# Patient Record
Sex: Female | Born: 1975 | Race: White | Hispanic: No | Marital: Single | Smoking: Current every day smoker
Health system: Southern US, Community
[De-identification: ages and names within clinical notes are randomized; demographics above are authoritative.]

## PROBLEM LIST (undated history)

## (undated) HISTORY — PX: HERNIA REPAIR: SHX51

## (undated) HISTORY — PX: ORTHOPEDIC SURGERY: SHX850

---

## 2009-12-30 ENCOUNTER — Encounter: Payer: Self-pay | Admitting: Emergency Medicine

## 2009-12-30 ENCOUNTER — Observation Stay (HOSPITAL_COMMUNITY): Admission: EM | Admit: 2009-12-30 | Discharge: 2010-01-01 | Payer: Self-pay | Admitting: Emergency Medicine

## 2010-06-07 LAB — CBC
HCT: 35.5 % — ABNORMAL LOW (ref 36.0–46.0)
MCH: 30.1 pg (ref 26.0–34.0)
MCHC: 33.7 g/dL (ref 30.0–36.0)
MCV: 91.5 fL (ref 78.0–100.0)
Platelets: 223 10*3/uL (ref 150–400)
Platelets: 233 10*3/uL (ref 150–400)
RBC: 3.88 MIL/uL (ref 3.87–5.11)
WBC: 9.6 10*3/uL (ref 4.0–10.5)

## 2010-06-07 LAB — BASIC METABOLIC PANEL
BUN: 11 mg/dL (ref 6–23)
CO2: 25 mEq/L (ref 19–32)
Calcium: 9.2 mg/dL (ref 8.4–10.5)
Chloride: 111 mEq/L (ref 96–112)
Chloride: 113 mEq/L — ABNORMAL HIGH (ref 96–112)
Creatinine, Ser: 0.64 mg/dL (ref 0.4–1.2)
Creatinine, Ser: 0.64 mg/dL (ref 0.4–1.2)
GFR calc Af Amer: >60 mL/min (ref >60)
GFR calc Af Amer: >60 mL/min (ref >60)
Glucose, Bld: 103 mg/dL — ABNORMAL HIGH (ref 70–99)
Potassium: 3.1 mEq/L — ABNORMAL LOW (ref 3.5–5.1)
Potassium: 3.5 mEq/L (ref 3.5–5.1)
Sodium: 140 mEq/L (ref 135–145)

## 2010-06-07 LAB — DIFFERENTIAL
Basophils Relative: 0 % (ref 0–1)
Eosinophils Absolute: 0.1 10*3/uL (ref 0.0–0.7)
Neutrophils Relative %: 74 % (ref 43–77)

## 2010-06-07 LAB — GLUCOSE, CAPILLARY: Glucose-Capillary: 103 mg/dL — ABNORMAL HIGH (ref 70–99)

## 2010-06-07 LAB — RAPID URINE DRUG SCREEN, HOSP PERFORMED: Tetrahydrocannabinol: NOT DETECTED
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
## 2012-12-28 ENCOUNTER — Emergency Department (HOSPITAL_COMMUNITY)
Admission: EM | Admit: 2012-12-28 | Discharge: 2012-12-29 | Disposition: A | Discharge status: Home / Self Care | Payer: PRIVATE HEALTH INSURANCE | Attending: Emergency Medicine | Admitting: Emergency Medicine

## 2012-12-28 ENCOUNTER — Encounter (HOSPITAL_COMMUNITY): Payer: Self-pay | Admitting: Emergency Medicine

## 2012-12-28 DIAGNOSIS — M25539 Pain in unspecified wrist: Secondary | ICD-10-CM | POA: Insufficient documentation

## 2012-12-28 DIAGNOSIS — D1739 Benign lipomatous neoplasm of skin and subcutaneous tissue of other sites: Secondary | ICD-10-CM | POA: Insufficient documentation

## 2012-12-28 DIAGNOSIS — F17200 Nicotine dependence, unspecified, uncomplicated: Secondary | ICD-10-CM | POA: Insufficient documentation

## 2012-12-28 DIAGNOSIS — D171 Benign lipomatous neoplasm of skin and subcutaneous tissue of trunk: Secondary | ICD-10-CM

## 2012-12-28 DIAGNOSIS — M25531 Pain in right wrist: Secondary | ICD-10-CM

## 2012-12-28 NOTE — ED Notes (Signed)
Patient c/o knot in her left mid back. Patient also c/o knot in her abdomen that she noticed last night.  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

## 2012-12-29 MED ORDER — OXYCODONE-ACETAMINOPHEN 5-325 MG PO TABS
1.0000 | ORAL_TABLET | Freq: Once | ORAL | Status: AC
  Administered 2012-12-29: 1 via ORAL
  Filled 2012-12-29: qty 1

## 2012-12-29 NOTE — ED Provider Notes (Signed)
CSN: [id number]     Arrival date & time 12/28/12  2320 History   First MD Initiated Contact with Patient 12/28/12 2352     Chief Complaint - right wrist pain pain, knots on back and abdomen  Patient is a 37 y.o. female presenting with wrist pain. The history is provided by the patient.  Wrist Pain This is a new problem. The current episode started more than 2 days ago. The problem occurs daily. The problem has not changed since onset.Exacerbated by: movement. The symptoms are relieved by rest. She has tried rest for the symptoms. The treatment provided no relief.  pt presents for two complaints She reports right wrist pain for awhile as while she is at work she uses a knife and has to frequently bend her wrist and this causes pain.  No injury to the hand or wrist.    Also reports "knots" on her back and abdomen.  The knot on her back has been present for up to a month and the one on her abdome she just noticed.  No overlying redness.  No fever/vomiting   PMH - none  Past Surgical History  Procedure Laterality Date  . Cesarean section    . Orthopedic surgery     No family history on file. History  Substance Use Topics  . Smoking status: Current Some Day Smoker  . Smokeless tobacco: Not on file  . Alcohol Use: No   OB History   Grav Para Term Preterm Abortions TAB SAB Ect Mult Living                 Review of Systems  Constitutional: Negative for fever.  Gastrointestinal: Negative for vomiting.    Allergies  Review of patient's allergies indicates no known allergies.  Home Medications   Current Outpatient Rx  Name  Route  Sig  Dispense  Refill  . ALPRAZolam (XANAX) 1 MG tablet   Oral   Take 1 mg by mouth at bedtime as needed for sleep.          BP 143/98  Pulse 94  Temp(Src) 98.4 F (36.9 C) (Oral)  Resp 18  Ht 5\' 7"  (1.702 m)  Wt 185 lb (83.915 kg)  BMI 28.97 kg/m2  SpO2 98%  LMP 12/20/2012 Physical Exam CONSTITUTIONAL: Well developed/well nourished HEAD:  Normocephalic/atraumatic EYES: EOMI ENMT: Mucous membranes moist NECK: supple no meningeal signs CV: S1/S2 noted, no murmurs/rubs/gallops noted LUNGS: Lungs are clear to auscultation bilaterally, no apparent distress ABDOMEN: soft, nontender, no rebound or guarding GU:no cva tenderness NEURO: Pt is awake/alert, moves all extremitiesx4 EXTREMITIES: pulses normal, full ROM, mild tenderness to palpation of right wrist, no erythema/warmth or edema noted.  She can fully range right wrist SKIN: warm, color normal.  One small ?lipoma to left flank and one small lesion on left upper abdomen.  No overlying warmth.  The lesions are mobile. PSYCH: no abnormalities of mood noted  ED Course  Procedures   MDM   1. Lipoma of abdominal wall   2. Lipoma of back   3. Right wrist pain    Nursing notes including past medical history and social history reviewed and considered in documentation  She may have small lipomas under skin.  At this point she is well appearing and in no distress.  Advised if they become larger over next month or more painful, she should have them re-evaluated For her wrist pain, advised splint and ortho followup     Joya Gaskins, MD  12/29/12 0008 

## 2012-12-30 ENCOUNTER — Telehealth: Payer: Self-pay | Admitting: Orthopedic Surgery

## 2012-12-30 NOTE — Telephone Encounter (Signed)
Katherine Huff called to request an appointment for wrist pain.  Said she has been to [employer].  She said this is work related and she is to get the workers [employer] information from her employer.  She will call us back once she is speaks with her employer.  If employer agrees that this is work related she will get all information  And call us back.

## 2019-04-01 ENCOUNTER — Other Ambulatory Visit: Payer: Self-pay

## 2019-04-01 ENCOUNTER — Ambulatory Visit: Payer: PRIVATE HEALTH INSURANCE | Attending: Internal Medicine

## 2019-04-01 DIAGNOSIS — Z20822 Contact with and (suspected) exposure to covid-19: Secondary | ICD-10-CM

## 2019-04-03 LAB — NOVEL CORONAVIRUS, NAA: SARS-CoV-2, NAA: NOT DETECTED

## 2022-01-05 ENCOUNTER — Encounter (HOSPITAL_COMMUNITY): Payer: Self-pay | Admitting: *Deleted

## 2022-01-05 ENCOUNTER — Other Ambulatory Visit: Payer: Self-pay

## 2022-01-05 ENCOUNTER — Emergency Department (HOSPITAL_COMMUNITY)
Admission: EM | Admit: 2022-01-05 | Discharge: 2022-01-05 | Disposition: A | Discharge status: Home / Self Care | Payer: PRIVATE HEALTH INSURANCE | Attending: Emergency Medicine | Admitting: Emergency Medicine

## 2022-01-05 DIAGNOSIS — K0889 Other specified disorders of teeth and supporting structures: Secondary | ICD-10-CM

## 2022-01-05 MED ORDER — HYDROCODONE-ACETAMINOPHEN 5-325 MG PO TABS
2.0000 | ORAL_TABLET | Freq: Once | ORAL | Status: AC
  Administered 2022-01-05: 2 via ORAL
  Filled 2022-01-05: qty 2

## 2022-01-05 MED ORDER — IBUPROFEN 400 MG PO TABS
600.0000 mg | ORAL_TABLET | Freq: Once | ORAL | Status: AC
  Administered 2022-01-05: 600 mg via ORAL
  Filled 2022-01-05: qty 2

## 2022-01-05 MED ORDER — NAPROXEN 500 MG PO TABS: 500.0000 mg | ORAL_TABLET | Freq: Two times a day (BID) | ORAL | 0 refills | Status: DC

## 2022-01-05 MED ORDER — OXYCODONE HCL 5 MG PO TABS: 5.0000 mg | ORAL_TABLET | ORAL | 0 refills | Status: DC | PRN

## 2022-01-05 NOTE — Discharge Instructions (Addendum)
You were seen today for dental pain.  Please follow-up as soon as possible with dentistry for further evaluation and management of your upper right molar.  Please take the prescribed anti-inflammatory naproxen as directed.  I also recommend taking 1000 mg of Tylenol every 6 hours (2 extra strength Tylenol every 6 hours).  The Roxicodone that was prescribed is for breakthrough pain.  This pain will likely persist until dental follow-up.

## 2022-01-05 NOTE — ED Provider Notes (Signed)
[facility] EMERGENCY DEPARTMENT Provider Note   CSN: [id number] Arrival date & time: 01/05/22  1936     History  Chief Complaint  Patient presents with   Dental Pain    Katherine Huff is a 46 y.o. female.  Patient presents to the hospital with chief complaint of dental pain.  Patient states that for the past week she has noticed a broken tooth in the upper right side.  She states the pain has been increasing over the past week.  She denies any fevers, nausea, vomiting.  Denies any difficulty swallowing.  Past medical history noncontributory  HPI     Home Medications Prior to Admission medications   Medication Sig Start Date End Date Taking? Authorizing Provider  naproxen (NAPROSYN) 500 MG tablet Take 1 tablet (500 mg total) by mouth 2 (two) times daily. 01/05/22  Yes Dorothyann Peng, PA-C  oxyCODONE (ROXICODONE) 5 MG immediate release tablet Take 1 tablet (5 mg total) by mouth every 4 (four) hours as needed for severe pain. 01/05/22  Yes Dorothyann Peng, PA-C  ALPRAZolam Duanne Moron) 1 MG tablet Take 1 mg by mouth at bedtime as needed for sleep.    [provider]      Allergies    Patient has no known allergies.    Review of Systems   Review of Systems  HENT:  Positive for dental problem.     Physical Exam Updated Vital Signs BP (!) 183/120 (BP Location: Right Arm)   Pulse 89   Temp 97.8 F (36.6 C) (Oral)   Resp 18   Ht '5\' 7"'$  (1.702 m)   Wt 86.2 kg   LMP 12/19/2021   SpO2 100%   BMI 29.76 kg/m  Physical Exam HENT:     Head: Normocephalic and atraumatic.     Mouth/Throat:     Mouth: Mucous membranes are moist.     Dentition: Abnormal dentition. Dental tenderness present. No gingival swelling or dental abscesses.     Pharynx: Uvula midline. No uvula swelling.     Tonsils: No tonsillar abscesses.   Eyes:     Pupils: Pupils are equal, round, and reactive to light.  Cardiovascular:     Rate and Rhythm: Normal rate.  Pulmonary:     Effort:  Pulmonary effort is normal. No respiratory distress.  Musculoskeletal:        General: No signs of injury.     Cervical back: Normal range of motion.  Skin:    General: Skin is dry.     Capillary Refill: Capillary refill takes less than 2 seconds.  Neurological:     Mental Status: She is alert.  Psychiatric:        Speech: Speech normal.        Behavior: Behavior normal.     ED Results / Procedures / Treatments   Labs (all labs ordered are listed, but only abnormal results are displayed) Labs Reviewed - No data to display  EKG None  Radiology No results found.  Procedures Procedures    Medications Ordered in ED Medications  HYDROcodone-acetaminophen (NORCO/VICODIN) 5-325 MG per tablet 2 tablet (2 tablets Oral Given 01/05/22 2027)  ibuprofen (ADVIL) tablet 600 mg (600 mg Oral Given 01/05/22 2027)    ED Course/ Medical Decision Making/ A&P                           Medical Decision Making Risk Prescription drug management.   Patient presents with  a chief complaint of dental pain.  Differential diagnosis includes but is not limited to dental injury, dental abscess, peritonsillar abscess, dental caries, and others  Patient has no past medical history that is relevant.  There is no indication at this time for lab work.  There is medication at this time for imaging  I ordered the patient hydrocodone and ibuprofen for pain and inflammation.  Pending assessment the patient was feeling somewhat better  I see no signs of a dental abscess at this time.  No swelling or significant erythema.  Patient's voice is normal.  Uvula is midline.  No signs of peritonsillar or retropharyngeal abscess.  Patient declines difficulty swallowing.  This appears to be pain due to a dental carry which is led to an injury of the upper right molar.  Patient states she has a dentist that she has not seen in some time and plans to call on Monday morning to schedule an urgent appointment.  Patient  states she has not had insurance for a few years and just recently got her dental insurance back. Plan to discharge patient home with a short course of pain medication and prescription anti-inflammatories. Patient urged to follow up with dentistry as soon as possible for follow up.         Final Clinical Impression(s) / ED Diagnoses Final diagnoses:  Pain, dental    Rx / DC Orders ED Discharge Orders          Ordered    oxyCODONE (ROXICODONE) 5 MG immediate release tablet  Every 4 hours PRN        01/05/22 2034    naproxen (NAPROSYN) 500 MG tablet  2 times daily        01/05/22 2034              Ronny Bacon 01/05/22 2035    Hayden Rasmussen, MD 01/06/22 1045

## 2022-01-05 NOTE — ED Triage Notes (Signed)
Pt with right upper dental pain x 1 week, pt states she noticed one tooth was broken.  Pt now has pain to right ear that just started today.

## 2023-01-25 ENCOUNTER — Encounter (HOSPITAL_COMMUNITY): Payer: Self-pay

## 2023-01-25 ENCOUNTER — Emergency Department (HOSPITAL_COMMUNITY): Payer: BC Managed Care – PPO

## 2023-01-25 ENCOUNTER — Other Ambulatory Visit: Payer: Self-pay

## 2023-01-25 ENCOUNTER — Emergency Department (HOSPITAL_COMMUNITY)
Admission: EM | Admit: 2023-01-25 | Discharge: 2023-01-26 | Disposition: A | Discharge status: Home / Self Care | Payer: BC Managed Care – PPO

## 2023-01-25 DIAGNOSIS — R0600 Dyspnea, unspecified: Secondary | ICD-10-CM | POA: Insufficient documentation

## 2023-01-25 DIAGNOSIS — R0789 Other chest pain: Secondary | ICD-10-CM | POA: Diagnosis not present

## 2023-01-25 DIAGNOSIS — D649 Anemia, unspecified: Secondary | ICD-10-CM

## 2023-01-25 DIAGNOSIS — R000 Tachycardia, unspecified: Secondary | ICD-10-CM | POA: Diagnosis not present

## 2023-01-25 DIAGNOSIS — R0602 Shortness of breath: Secondary | ICD-10-CM | POA: Diagnosis not present

## 2023-01-25 DIAGNOSIS — R030 Elevated blood-pressure reading, without diagnosis of hypertension: Secondary | ICD-10-CM

## 2023-01-25 DIAGNOSIS — R7989 Other specified abnormal findings of blood chemistry: Secondary | ICD-10-CM | POA: Diagnosis not present

## 2023-01-25 DIAGNOSIS — F17200 Nicotine dependence, unspecified, uncomplicated: Secondary | ICD-10-CM | POA: Insufficient documentation

## 2023-01-25 DIAGNOSIS — R002 Palpitations: Secondary | ICD-10-CM | POA: Insufficient documentation

## 2023-01-25 LAB — BASIC METABOLIC PANEL
Anion gap: 8 (ref 5–15)
BUN: 9 mg/dL (ref 6–20)
CO2: 23 mmol/L (ref 22–32)
Calcium: 9.1 mg/dL (ref 8.9–10.3)
Chloride: 110 mmol/L (ref 98–111)
Creatinine, Ser: 0.37 mg/dL — ABNORMAL LOW (ref 0.44–1.00)
GFR, Estimated: >60 mL/min (ref >60)
Glucose, Bld: 104 mg/dL — ABNORMAL HIGH (ref 70–99)
Potassium: 3.5 mmol/L (ref 3.5–5.1)
Sodium: 141 mmol/L (ref 135–145)

## 2023-01-25 LAB — CBC
HCT: 27.7 % — ABNORMAL LOW (ref 36.0–46.0)
Hemoglobin: 7.8 g/dL — ABNORMAL LOW (ref 12.0–15.0)
MCH: 17.8 pg — ABNORMAL LOW (ref 26.0–34.0)
MCHC: 28.2 g/dL — ABNORMAL LOW (ref 30.0–36.0)
MCV: 63.1 fL — ABNORMAL LOW (ref 80.0–100.0)
Platelets: 185 10*3/uL (ref 150–400)
RBC: 4.39 MIL/uL (ref 3.87–5.11)
RDW: 19.2 % — ABNORMAL HIGH (ref 11.5–15.5)
WBC: 6 10*3/uL (ref 4.0–10.5)
nRBC: 0 % (ref 0.0–0.2)

## 2023-01-25 LAB — BRAIN NATRIURETIC PEPTIDE: B Natriuretic Peptide: 396 pg/mL — ABNORMAL HIGH (ref 0.0–100.0)

## 2023-01-25 LAB — TROPONIN I (HIGH SENSITIVITY)
Troponin I (High Sensitivity): 10 ng/L (ref <18)
Troponin I (High Sensitivity): 9 ng/L (ref <18)

## 2023-01-25 LAB — D-DIMER, QUANTITATIVE: D-Dimer, Quant: 1.52 ug{FEU}/mL — ABNORMAL HIGH (ref 0.00–0.50)

## 2023-01-25 LAB — TYPE AND SCREEN
ABO/RH(D): A POS
Antibody Screen: NEGATIVE

## 2023-01-25 LAB — POC URINE PREG, ED: Preg Test, Ur: NEGATIVE

## 2023-01-25 LAB — TSH: TSH: <0.01 u[IU]/mL — ABNORMAL LOW (ref 0.350–4.500)

## 2023-01-25 MED ORDER — IOHEXOL 350 MG/ML SOLN
75.0000 mL | Freq: Once | INTRAVENOUS | Status: AC | PRN
  Administered 2023-01-25: 75 mL via INTRAVENOUS

## 2023-01-25 MED ORDER — IPRATROPIUM-ALBUTEROL 0.5-2.5 (3) MG/3ML IN SOLN
3.0000 mL | Freq: Once | RESPIRATORY_TRACT | Status: AC
  Administered 2023-01-25: 3 mL via RESPIRATORY_TRACT
  Filled 2023-01-25: qty 3

## 2023-01-25 NOTE — ED Notes (Signed)
Going to CT 

## 2023-01-25 NOTE — ED Provider Notes (Signed)
[place] EMERGENCY DEPARTMENT AT [facility] Provider Note   CSN: [id number] Arrival date & time: 01/25/23  1837     History  Chief Complaint  Patient presents with   Chest Pain    Katherine Huff is a 47 y.o. female.  47 year old female with past medical history of tobacco abuse presenting to the emergency department today with right-sided chest pain.  The patient states that she woke up with this this morning.  She also reports that she has been having some dyspnea intermittently throughout the day.  She reports that she is coughing but this is been nonproductive.  The patient does smoke.  She was having some palpitations and lightheadedness earlier today 2.  The patient has not seen a doctor in quite some time.  She does not have any known medical issues.  She denies any hemoptysis.   Chest Pain Associated symptoms: palpitations and shortness of breath        Home Medications Prior to Admission medications   Medication Sig Start Date End Date Taking? Authorizing Provider  ibuprofen (ADVIL) 200 MG tablet Take 200 mg by mouth every 6 (six) hours as needed for mild pain (pain score 1-3).   Yes [provider]      Allergies    Patient has no known allergies.    Review of Systems   Review of Systems  Respiratory:  Positive for shortness of breath.   Cardiovascular:  Positive for chest pain and palpitations.  All other systems reviewed and are negative.   Physical Exam Updated Vital Signs BP (!) 161/77   Pulse 99   Temp 99.2 F (37.3 C)   Resp 20   Ht 5\' 6"  (1.676 m)   Wt 81.6 kg   SpO2 100%   BMI 29.05 kg/m  Physical Exam Vitals and nursing note reviewed.   Gen: NAD Eyes: PERRL, EOMI HEENT: no oropharyngeal swelling Neck: trachea midline Resp: Diffuse wheezes throughout all lung fields Card: RRR ([id number]), no murmurs, rubs, or gallops Abd: nontender, nondistended Extremities: no calf tenderness, 1+ pitting edema bilaterally Vascular:  2+ radial pulses bilaterally, 2+ DP pulses bilaterally Neuro: Cranial nerves intact, equal strength and sensation throughout bilateral upper and lower extremities. Skin: no rashes Psyc: acting appropriately   ED Results / Procedures / Treatments   Labs (all labs ordered are listed, but only abnormal results are displayed) Labs Reviewed  BASIC METABOLIC PANEL - Abnormal; Notable for the following components:      Result Value   Glucose, Bld 104 (*)    Creatinine, Ser 0.37 (*)    All other components within normal limits  CBC - Abnormal; Notable for the following components:   Hemoglobin 7.8 (*)    HCT 27.7 (*)    MCV 63.1 (*)    MCH 17.8 (*)    MCHC 28.2 (*)    RDW 19.2 (*)    All other components within normal limits  BRAIN NATRIURETIC PEPTIDE - Abnormal; Notable for the following components:   B Natriuretic Peptide 396.0 (*)    All other components within normal limits  D-DIMER, QUANTITATIVE - Abnormal; Notable for the following components:   D-Dimer, Quant 1.52 (*)    All other components within normal limits  TSH  URINALYSIS, ROUTINE W REFLEX MICROSCOPIC  POC URINE PREG, ED  TYPE AND SCREEN  TROPONIN I (HIGH SENSITIVITY)  TROPONIN I (HIGH SENSITIVITY)    EKG EKG Interpretation Date/Time:  Saturday January 25 2023 18:48:20 EDT Ventricular Rate:  105 PR Interval:  158 QRS Duration:  72 QT Interval:  324 QTC Calculation: 428 R Axis:   47  Text Interpretation: Sinus tachycardia Otherwise normal ECG No previous ECGs available Confirmed by Beckey Downing [phone]) on 01/25/2023 8:08:02 PM  Radiology DG Chest 2 View  Result Date: 01/25/2023 CLINICAL DATA:  Chest pain EXAM: CHEST - 2 VIEW COMPARISON:  12/30/2009 FINDINGS: Peribronchial thickening. Heart and mediastinal contours are within normal limits. No focal opacities or effusions. No acute bony abnormality. IMPRESSION: Mild bronchitic changes. Electronically Signed   By: Charlett Nose M.D.   On: 01/25/2023 19:28     Procedures Procedures    Medications Ordered in ED Medications  ipratropium-albuterol (DUONEB) 0.5-2.5 (3) MG/3ML nebulizer solution 3 mL (3 mLs Nebulization Given 01/25/23 2027)    ED Course/ Medical Decision Making/ A&P                                 Medical Decision Making 47 year old female with no known past medical history presenting to the emergency department today with right-sided chest pain, palpitations, and cough.  I will further evaluate the patient here with basic labs Wels and EKG, chest x-ray, and troponin for further evaluation for ACS, pulmonary edema, pulmonary infiltrates, pneumothorax.  Based on description of her symptoms suspicion for aortic dissection is low at this time.  She has a reassuring neurovascular exam here as well.  The patient was tachycardic here on arrival.  I will obtain a D-dimer to evaluate for pulmonary embolism.  She is wheezing here on exam.  I am unclear if this is a cause of her symptoms or more of a chronic issue.  I will give her a DuoNeb.  See if his helps with her symptoms.  I will reevaluate for ultimate disposition.  Her EKG interpreted by me does not show any acute ischemic changes.  The patient's x-ray shows some changes consistent with bronchitis.  Her D-dimer is positive.  Her BNP is also mildly elevated.  Our CT scanner here at [facility] is down currently.  I did talk with Dr. Andria Meuse at the: [facility] emergency department who accepts patient for transfer for CT imaging and further management.  The patient is found to be anemic as well.  I made clear the etiology of this as well.  She may require admission for further workup.  Amount and/or Complexity of Data Reviewed Labs: ordered. Radiology: ordered.  Risk Prescription drug management.           Final Clinical Impression(s) / ED Diagnoses Final diagnoses:  Dyspnea, unspecified type    Rx / DC Orders ED Discharge Orders     None         Durwin Glaze,  MD 01/25/23 2051

## 2023-01-25 NOTE — ED Notes (Incomplete)
Pt arrived by [facility] from [facility],

## 2023-01-25 NOTE — ED Notes (Signed)
Pt seen leaving the dept with [facility] at this time.

## 2023-01-25 NOTE — ED Triage Notes (Signed)
Pt reports right side chest pain and feeling her heart racing intermittently since 4 am.  Pt reports she has not seen a doctor in a long time and her BP was elevated at home so she wanted to be checked out.

## 2023-01-25 NOTE — ED Provider Notes (Signed)
11:32 PM Care assumed in transfer. Well appearing female transferred from ED at [facility] for continued evaluation of R sided chest pain, palpitations, cough. Noted some DOE with prior provider. Patient with some tachycardia of 103-105bpm. No hypoxia. Minimal adventitious breath sounds c/w smoking history. D dimer elevated. CTA chest ordered. CT transport at bedside to bring patient for imaging.  1:00 AM CTA negative for PE. Patient does have some mild pulmonary edema, in keeping with mild BNP elevation. There is some asymmetric enlargement of the left thyroid lobe with some heterogeneous enhancement.  Unclear whether or not this may correlate with the patient's low TSH level.  Based on Burch-Wartofsky scale, low likelihood for thyroid storm.  Performed DRE at bedside; chaperoned by RN. No gross blood or melena noted. Occult stool results pending.  1:06 AM Occult stool negative. Patient states she is perimenopausal. Denies menorrhagia. Given MCV this anemia may very well be iron deficiency; no trending available for the past 13 years.  1:30 AM Had a long conversation with the patient regarding her evaluation and disposition plan.  I have offered the patient admission for further evaluation of her complaints versus outpatient follow-up with her primary care doctor.  I have stressed the importance of outpatient follow-up to which the patient verbalizes understanding.  She chooses to forego admission at this time.  I do not feel this is presently unreasonable.  Her anemia will certainly need to be followed.  Anemia panel was ordered.  She is not hemodynamically unstable.  No hypotension to suggest acute/emergent blood loss.  Her Hemoccult is negative.  Does have some evidence of pulmonary edema which may be related to her untreated high blood pressure.  She has been started on Norvasc and instructed to have blood pressure rechecked by her doctor.  She is not have any respiratory distress or hypoxia.   Troponin has been trended and is negative.  BNP only mildly elevated.  Low threshold for patient return should symptoms worsen. Return precautions discussed and provided. Patient discharged in stable condition with no unaddressed concerns.     Antony Madura, PA-C 01/26/23 0229    Sloan Leiter, DO 01/26/23 [phone]

## 2023-01-26 DIAGNOSIS — R0600 Dyspnea, unspecified: Secondary | ICD-10-CM | POA: Diagnosis not present

## 2023-01-26 LAB — POC OCCULT BLOOD, ED: Fecal Occult Bld: NEGATIVE

## 2023-01-26 LAB — IRON AND TIBC
Iron: 28 ug/dL (ref 28–170)
Saturation Ratios: 7 % — ABNORMAL LOW (ref 10.4–31.8)
TIBC: 416 ug/dL (ref 250–450)
UIBC: 388 ug/dL

## 2023-01-26 LAB — T4, FREE: Free T4: 5.12 ng/dL — ABNORMAL HIGH (ref 0.61–1.12)

## 2023-01-26 LAB — RETICULOCYTES
Immature Retic Fract: 12.1 % (ref 2.3–15.9)
RBC.: 4.14 MIL/uL (ref 3.87–5.11)
Retic Count, Absolute: 55.1 10*3/uL (ref 19.0–186.0)
Retic Ct Pct: 1.3 % (ref 0.4–3.1)

## 2023-01-26 LAB — VITAMIN B12: Vitamin B-12: 461 pg/mL (ref 180–914)

## 2023-01-26 LAB — FERRITIN: Ferritin: 5 ng/mL — ABNORMAL LOW (ref 11–307)

## 2023-01-26 LAB — FOLATE: Folate: 25.1 ng/mL (ref >5.9)

## 2023-01-26 MED ORDER — AMLODIPINE BESYLATE 5 MG PO TABS: 5.0000 mg | ORAL_TABLET | Freq: Every day | ORAL | 0 refills | Status: DC

## 2023-02-06 ENCOUNTER — Other Ambulatory Visit (HOSPITAL_COMMUNITY): Payer: Self-pay | Admitting: Family Medicine

## 2023-02-06 DIAGNOSIS — E0590 Thyrotoxicosis, unspecified without thyrotoxic crisis or storm: Secondary | ICD-10-CM

## 2023-04-01 ENCOUNTER — Encounter (HOSPITAL_COMMUNITY)
Admission: RE | Admit: 2023-04-01 | Discharge: 2023-04-01 | Disposition: A | Discharge status: Home / Self Care | Payer: BC Managed Care – PPO | Source: Ambulatory Visit | Attending: Family Medicine | Admitting: Family Medicine

## 2023-04-01 DIAGNOSIS — E0590 Thyrotoxicosis, unspecified without thyrotoxic crisis or storm: Secondary | ICD-10-CM | POA: Diagnosis present

## 2023-04-01 MED ORDER — SODIUM IODIDE I-123 7.4 MBQ CAPS
423.0000 | ORAL_CAPSULE | Freq: Once | ORAL | Status: AC
  Administered 2023-04-01: 423 via ORAL

## 2023-04-02 ENCOUNTER — Encounter (HOSPITAL_COMMUNITY)
Admission: RE | Admit: 2023-04-02 | Discharge: 2023-04-02 | Disposition: A | Discharge status: Home / Self Care | Payer: BC Managed Care – PPO | Source: Ambulatory Visit | Attending: Family Medicine | Admitting: Family Medicine

## 2023-04-17 LAB — TSH: TSH: 0.01 — AB (ref 0.41–5.90)

## 2023-04-21 ENCOUNTER — Encounter: Payer: Self-pay | Admitting: Nurse Practitioner

## 2023-04-21 ENCOUNTER — Ambulatory Visit: Payer: Medicaid Other | Admitting: Nurse Practitioner

## 2023-04-21 VITALS — BP 142/83 | HR 80 | Ht 67.0 in | Wt 197.0 lb

## 2023-04-21 DIAGNOSIS — E0500 Thyrotoxicosis with diffuse goiter without thyrotoxic crisis or storm: Secondary | ICD-10-CM

## 2023-04-21 DIAGNOSIS — E0590 Thyrotoxicosis, unspecified without thyrotoxic crisis or storm: Secondary | ICD-10-CM

## 2023-04-21 MED ORDER — PROPRANOLOL HCL ER 60 MG PO CP24: 60.0000 mg | ORAL_CAPSULE | Freq: Every day | ORAL | 1 refills | Status: DC

## 2023-04-21 MED ORDER — METHIMAZOLE 10 MG PO TABS: 10.0000 mg | ORAL_TABLET | Freq: Two times a day (BID) | ORAL | 1 refills | Status: DC

## 2023-04-21 NOTE — Patient Instructions (Signed)
Hyperthyroidism Hyperthyroidism refers to a thyroid gland that is too active or overactive. The thyroid gland is a small gland located in the lower front part of the neck, just in front of the windpipe (trachea). This gland makes hormones that: Help control how the body uses food for energy (metabolism). Help the heart and brain work well. Keep your bones strong. When the thyroid is overactive, it produces too much of a hormone called thyroxine. What are the causes? This condition may be caused by: Graves' disease. This is a disorder in which the body's disease-fighting system (immune system) attacks the thyroid gland. This is the most common cause. Inflammation of the thyroid gland. A tumor in the thyroid gland. Use of certain medicines, including: Prescription thyroid hormone replacement. Herbal supplements that mimic thyroid hormones. Amiodarone therapy. Solid or fluid-filled lumps within your thyroid gland (thyroid nodules). Taking in a large amount of iodine from foods or medicines. What increases the risk? You are more likely to develop this condition if: You are female. You have a family history of thyroid conditions. You smoke tobacco. You use a medicine called lithium. You take medicines that affect the immune system (immunosuppressants). What are the signs or symptoms? Symptoms of this condition include: Nervousness. Inability to tolerate heat. Diarrhea. Rapid heart rate. Shaky hands. Restlessness. Sleep problems. Other symptoms may include: Heart skipping beats or making extra beats. Unexplained weight loss. Change in the texture of hair or skin. Loss of menstruation. Fatigue. Enlarged thyroid gland or a lump in the thyroid (nodule). You may also have symptoms of Graves' disease, which may include: Protruding eyes. Dry eyes. Red or swollen eyes. Problems with vision. How is this diagnosed? This condition may be diagnosed based on: Your symptoms and medical  history. A physical exam. Blood tests. Thyroid ultrasound. This test involves using sound waves to produce images of the thyroid gland. A thyroid scan. A radioactive substance is injected into a vein, and images show how much iodine is present in the thyroid. Radioactive iodine uptake test (RAIU). A small amount of radioactive iodine is given by mouth to see how much iodine the thyroid absorbs after a certain amount of time. How is this treated? Treatment depends on the cause and severity of the condition. Treatment may include: Medicines to reduce the amount of thyroid hormone your body makes. Medicines to help manage your symptoms. Radioactive iodine treatment (radioiodine therapy). This involves swallowing a small dose of radioactive iodine, in capsule or liquid form, to kill thyroid cells. Surgery to remove part or all of your thyroid gland. You may need to take thyroid hormone replacement medicine for the rest of your life after thyroid surgery. Follow these instructions at home:  Take over-the-counter and prescription medicines only as told by your health care provider. Do not use any products that contain nicotine or tobacco. These products include cigarettes, chewing tobacco, and vaping devices, such as e-cigarettes. If you need help quitting, ask your health care provider. Follow any instructions from your health care provider about diet. You may be instructed to limit foods that contain iodine. Keep all follow-up visits. You will need to have blood tests regularly so that your health care provider can monitor your condition. Where to find more information [employer] [facility] Diabetes and Digestive [employer] Kidney Diseases: [email or web address] Contact a health care provider if: Your symptoms do not get better with treatment. You have a fever. You have abdominal pain. You feel dizzy. You are taking thyroid hormone replacement medicine and: You have  symptoms of depression. You feel like you  are tired all the time. You gain weight. Get help right away if: You have sudden, unexplained confusion or other mental changes. You have chest pain. You have fast or irregular heartbeats (palpitations). You have difficulty breathing. These symptoms may be an emergency. Get help right away. Call 911. Do not wait to see if the symptoms will go away. Do not drive yourself to the hospital. Summary The thyroid gland is a small gland located in the lower front part of the neck, just in front of the windpipe. Hyperthyroidism is when the thyroid gland is too active and produces too much of a hormone called thyroxine. The most common cause is Graves' disease, a disorder in which your immune system attacks the thyroid gland. Hyperthyroidism can cause various symptoms, such as unexplained weight loss, nervousness, inability to tolerate heat, or changes in your heartbeat. Treatment may include medicine to reduce the amount of thyroid hormone your body makes, radioiodine therapy, surgery, or medicines to manage symptoms. This information is not intended to replace advice given to you by your health care provider. Make sure you discuss any questions you have with your health care provider. Document Revised: 05/04/2021 Document Reviewed: 05/04/2021 Elsevier Patient Education  2024 [employer].

## 2023-04-21 NOTE — Progress Notes (Signed)
04/21/2023     Endocrinology Consult Note    Subjective:    Patient ID: Katherine Huff, female    DOB: 1975-08-12, PCP Richardean Chimera, MD.   History reviewed. No pertinent past medical history.  Past Surgical History:  Procedure Laterality Date   CESAREAN SECTION     HERNIA REPAIR     ORTHOPEDIC SURGERY      Social History   Socioeconomic History   Marital status: Single    Spouse name: Not on file   Number of children: Not on file   Years of education: Not on file   Highest education level: Not on file  Occupational History   Not on file  Tobacco Use   Smoking status: Every Day    Current packs/day: 1.00    Types: Cigarettes   Smokeless tobacco: Not on file  Vaping Use   Vaping status: Never Used  Substance and Sexual Activity   Alcohol use: No   Drug use: No   Sexual activity: Not on file  Other Topics Concern   Not on file  Social History Narrative   Not on file   Social Drivers of Health   Financial Resource Strain: Not on file  Food Insecurity: Not on file  Transportation Needs: Not on file  Physical Activity: Not on file  Stress: Not on file  Social Connections: Not on file    History reviewed. No pertinent family history.  Outpatient Encounter Medications as of 04/21/2023  Medication Sig   amLODipine (NORVASC) 5 MG tablet Take 1 tablet (5 mg total) by mouth daily.   ibuprofen (ADVIL) 200 MG tablet Take 200 mg by mouth every 6 (six) hours as needed for mild pain (pain score 1-3).   methimazole (TAPAZOLE) 10 MG tablet Take 1 tablet (10 mg total) by mouth 2 (two) times daily.   [DISCONTINUED] propranolol ER (INDERAL LA) 60 MG 24 hr capsule Take 60 mg by mouth daily.   propranolol ER (INDERAL LA) 60 MG 24 hr capsule Take 1 capsule (60 mg total) by mouth daily.   No facility-administered encounter medications on file as of 04/21/2023.    ALLERGIES: No Known Allergies  VACCINATION STATUS:  There is no immunization history on file  for this patient.   HPI  Katherine Huff is 48 y.o. female who presents today with a medical history as above. she is being seen in consultation for hyperthyroidism requested by Richardean Chimera, MD.  she has been dealing with symptoms of hair loss, sweating, palpitations, dry skin, fatigue, anxiety, weight loss, heat intolerance, insomnia, and tremors for about 2 months. These symptoms are progressively worsening and troubling to her.  her most recent thyroid labs revealed suppressed TSH < 0.005, elevated T4 of >24.9, T3 uptake of 54, and Free Thyroxine Index of >13.4 on 04/17/23.  she denies dysphagia, choking, shortness of breath, no recent voice change.    she does have family history of thyroid dysfunction in her maternal aunts (one has Graves, the other has Hashimotos), but denies family hx of thyroid cancer. she is not on any anti-thyroid medications nor on any thyroid hormone supplements. Denies use of Biotin containing supplements.  she is willing to proceed with appropriate work up and therapy for thyrotoxicosis.  Her PCP did put her on Propanolol to help with HR control.   Review of systems  Constitutional: + fluctuating body weight, current Body mass index is 30.85 kg/m., + fatigue, + subjective hyperthermia, no subjective hypothermia Eyes:  no blurry vision, no xerophthalmia ENT: no sore throat, no nodules palpated in throat, no dysphagia/odynophagia, no hoarseness Cardiovascular: no chest pain, + shortness of breath, + palpitations, no leg swelling Respiratory: no cough, + shortness of breath Gastrointestinal: no nausea/vomiting/diarrhea Musculoskeletal: + diffuse muscle/joint aches Skin: + rashes to legs and abdomen, no hyperemia Neurological: + tremors, no numbness, no tingling, + dizziness Psychiatric: no depression, + anxiety   Objective:    BP (!) 142/83 (BP Location: Right Arm, Patient Position: Sitting, Cuff Size: Large)   Pulse 80   Ht 5\' 7"  (1.702 m)   Wt 197 lb  (89.4 kg)   LMP 08/26/2022 (Within Weeks)   BMI 30.85 kg/m   Wt Readings from Last 3 Encounters:  04/21/23 197 lb (89.4 kg)  01/25/23 180 lb (81.6 kg)  01/05/22 190 lb (86.2 kg)     BP Readings from Last 3 Encounters:  04/21/23 (!) 142/83  01/26/23 (!) 168/95  01/05/22 (!) 183/120                          Physical Exam- Limited  Constitutional:  Body mass index is 30.85 kg/m. 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) Skin:  no rashes, no hyperemia, + dry scaly plaques to legs and elbows and hair line Neurological: + tremor with outstretched hands, DTR normal in BLE   CMP     Component Value Date/Time   NA 141 01/25/2023 1905   K 3.5 01/25/2023 1905   CL 110 01/25/2023 1905   CO2 23 01/25/2023 1905   GLUCOSE 104 (H) 01/25/2023 1905   BUN 9 01/25/2023 1905   CREATININE 0.37 (L) 01/25/2023 1905   CALCIUM 9.1 01/25/2023 1905   GFRNONAA >60 01/25/2023 1905     CBC    Component Value Date/Time   WBC 6.0 01/25/2023 1905   RBC 4.14 01/26/2023 0045   RBC 4.39 01/25/2023 1905   HGB 7.8 (L) 01/25/2023 1905   HCT 27.7 (L) 01/25/2023 1905   PLT 185 01/25/2023 1905   MCV 63.1 (L) 01/25/2023 1905   MCH 17.8 (L) 01/25/2023 1905   MCHC 28.2 (L) 01/25/2023 1905   RDW 19.2 (H) 01/25/2023 1905   LYMPHSABS 1.8 12/30/2009 0720   MONOABS 0.5 12/30/2009 0720   EOSABS 0.1 12/30/2009 0720   BASOSABS 0.0 12/30/2009 0720     Diabetic Labs (most recent): No results found for: "HGBA1C", "MICROALBUR"  Lipid Panel  No results found for: "CHOL", "TRIG", "HDL",  "CHOLHDL", "VLDL", "LDLCALC", "LDLDIRECT", "LABVLDL"   Lab Results  Component Value Date   TSH 0.01 (A) 04/17/2023   TSH <0.010 (L) 01/25/2023   FREET4 5.12 (H) 01/26/2023     Thyroid Uptake and Scan from 04/01/23 CLINICAL DATA:  Weight loss, anxiety, heat intolerance, increased appetite. Hyperthyroidism   EXAM: THYROID SCAN AND UPTAKE - 4 AND 24 HOURS   TECHNIQUE: Following oral administration of I-123 capsule, anterior planar imaging was acquired at 24 hours. Thyroid uptake was calculated with a thyroid probe at 4-6 hours and 24 hours.   RADIOPHARMACEUTICALS:  423 uCi I-123 sodium iodide p.o.  COMPARISON:  None Available.   FINDINGS: Uniform distribution of radionuclide throughout the thyroid parenchyma. No hot or cold defects identified.   4 hour I-123 uptake = 64.2% (normal 5-20%)   24 hour I-123 uptake = 68.1% (normal 10-30%)   IMPRESSION: Increased thyroid uptake of radioiodine in keeping with diffuse toxic goiter (Graves disease)     Electronically Signed   By: Helyn Numbers M.D.   On: 04/11/2023 20:55   Assessment & Plan:   1. Graves disease (Primary) 2. Hyperthyroidism  she is being seen at a kind request of Richardean Chimera, MD.  her history and most recent labs are reviewed, and she was examined clinically. Subjective and objective findings are consistent with thyrotoxicosis likely from primary hyperthyroidism. The potential risks of untreated thyrotoxicosis and the need for definitive therapy have been discussed in detail with her, and she agrees to proceed with diagnostic workup and treatment plan.   Will start her on Methimazole 10 mg po twice daily.  She can continue her Propanolol now.  I will repeat full profile thyroid function tests in 4 weeks (including antibody testing to assess for autoimmune thyroid dysfunction).  Uptake and scan results show 4 hr uptake of 64.2% and 24 hr uptake of 68.1%, consistent with Graves Disease.   Options of  therapy are discussed with her.  We discussed the option of treating it with medications including methimazole or PTU which may have side effects including rash, transaminitis, and bone marrow suppression.  We also discussed the option of definitive therapy with RAI ablation of the thyroid. If she is found to have primary hyperthyroidism from Graves' disease , toxic multinodular goiter or toxic nodular goiter the preferred modality of treatment would be I-131 thyroid ablation. Surgery is another choice of treatment in some cases, in her case surgery is not a good fit for presentation with only mild goiter.  -Patient is made aware of the high likelihood of post ablative hypothyroidism with subsequent need for lifelong thyroid hormone replacement. sheunderstands this outcome and she is willing to proceed.          -Patient is advised to maintain close follow up with Richardean Chimera, MD for primary care needs.   - Time spent with the patient: 60 minutes, of which >50% was spent in obtaining information about her symptoms, reviewing her previous labs, evaluations, and treatments, counseling her about her hyperthyroidism , and developing a plan to confirm the diagnosis and long term treatment as necessary. Please refer to "Patient Self Inventory" in the Media tab for reviewed elements of pertinent patient history.  Chinita Greenland participated in the discussions, expressed understanding, and voiced agreement with the above plans.  All questions were answered to her satisfaction. she is encouraged to contact clinic should she have any questions or concerns prior to her return visit.   Follow up plan: Return in about 4 weeks (around 05/19/2023) for Thyroid follow up, Previsit labs.   Thank you for involving me in the care of this pleasant patient, and I will continue to update you with her progress.    Ronny Bacon, [facility] [address] [place], [place]  [address] Phone: [phone] Fax: [phone]  04/21/2023, 1:46 PM

## 2023-05-17 LAB — T4, FREE: Free T4: 1.76 ng/dL (ref 0.82–1.77)

## 2023-05-17 LAB — TSH: TSH: <0.005 u[IU]/mL — ABNORMAL LOW (ref 0.450–4.500)

## 2023-05-17 LAB — T3, FREE: T3, Free: 5.5 pg/mL — ABNORMAL HIGH (ref 2.0–4.4)

## 2023-05-18 LAB — THYROID PEROXIDASE ANTIBODY: Thyroperoxidase Ab SerPl-aCnc: 152 [IU]/mL — ABNORMAL HIGH (ref 0–34)

## 2023-05-18 LAB — THYROGLOBULIN ANTIBODY

## 2023-05-21 ENCOUNTER — Ambulatory Visit (INDEPENDENT_AMBULATORY_CARE_PROVIDER_SITE_OTHER): Payer: Medicaid Other | Admitting: Nurse Practitioner

## 2023-05-21 ENCOUNTER — Encounter: Payer: Self-pay | Admitting: Nurse Practitioner

## 2023-05-21 VITALS — BP 120/84 | HR 62 | Ht 67.0 in | Wt 195.0 lb

## 2023-05-21 DIAGNOSIS — E0500 Thyrotoxicosis with diffuse goiter without thyrotoxic crisis or storm: Secondary | ICD-10-CM

## 2023-05-21 DIAGNOSIS — E0590 Thyrotoxicosis, unspecified without thyrotoxic crisis or storm: Secondary | ICD-10-CM | POA: Diagnosis not present

## 2023-05-21 NOTE — Progress Notes (Signed)
 05/21/2023     Endocrinology Follow Up Note    Subjective:    Patient ID: Katherine Huff, female    DOB: 06/16/1975, PCP Richardean Chimera, MD.   History reviewed. No pertinent past medical history.  Past Surgical History:  Procedure Laterality Date   CESAREAN SECTION     HERNIA REPAIR     ORTHOPEDIC SURGERY      Social History   Socioeconomic History   Marital status: Single    Spouse name: Not on file   Number of children: Not on file   Years of education: Not on file   Highest education level: Not on file  Occupational History   Not on file  Tobacco Use   Smoking status: Every Day    Current packs/day: 1.00    Types: Cigarettes   Smokeless tobacco: Not on file  Vaping Use   Vaping status: Never Used  Substance and Sexual Activity   Alcohol use: No   Drug use: No   Sexual activity: Not on file  Other Topics Concern   Not on file  Social History Narrative   Not on file   Social Drivers of Health   Financial Resource Strain: Not on file  Food Insecurity: Not on file  Transportation Needs: Not on file  Physical Activity: Not on file  Stress: Not on file  Social Connections: Not on file    History reviewed. No pertinent family history.  Outpatient Encounter Medications as of 05/21/2023  Medication Sig   amLODipine (NORVASC) 5 MG tablet Take 1 tablet (5 mg total) by mouth daily.   ibuprofen (ADVIL) 200 MG tablet Take 200 mg by mouth every 6 (six) hours as needed for mild pain (pain score 1-3).   methimazole (TAPAZOLE) 10 MG tablet Take 1 tablet (10 mg total) by mouth 2 (two) times daily.   propranolol ER (INDERAL LA) 60 MG 24 hr capsule Take 1 capsule (60 mg total) by mouth daily.   No facility-administered encounter medications on file as of 05/21/2023.    ALLERGIES: No Known Allergies  VACCINATION STATUS:  There is no immunization history on file for this patient.   HPI  Katherine Huff is 48 y.o. female who presents today with a  medical history as above. she is being seen in follow up after being seen in consultation for hyperthyroidism requested by Richardean Chimera, MD.  she has been dealing with symptoms of hair loss, sweating, palpitations, dry skin, fatigue, anxiety, weight loss, heat intolerance, insomnia, and tremors for about 2 months. These symptoms are progressively worsening and troubling to her.  her most recent thyroid labs revealed suppressed TSH < 0.005, elevated T4 of >24.9, T3 uptake of 54, and Free Thyroxine Index of >13.4 on 04/17/23.  she denies dysphagia, choking, shortness of breath, no recent voice change.    she does have family history of thyroid dysfunction in her maternal aunts (one has Graves, the other has Hashimotos), but denies family hx of thyroid cancer. she is not on any anti-thyroid medications nor on any thyroid hormone supplements. Denies use of Biotin containing supplements.  she is willing to proceed with appropriate work up and therapy for thyrotoxicosis.  Her PCP did put her on Propanolol to help with HR control.   Review of systems  Constitutional: + fluctuating body weight, current Body mass index is 30.54 kg/m., + fatigue, + subjective hyperthermia, no subjective hypothermia Eyes: no blurry vision, no xerophthalmia ENT: no sore throat, no nodules  palpated in throat, no dysphagia/odynophagia, no hoarseness Cardiovascular: no chest pain, + shortness of breath (improved), + palpitations (somewhat improved), no leg swelling Respiratory: no cough, + shortness of breath (improved) Gastrointestinal: no nausea/vomiting/diarrhea Musculoskeletal: + diffuse muscle/joint aches Skin: + rashes to legs and abdomen, no hyperemia Neurological: + tremors, no numbness, no tingling, + dizziness-somewhat improved Psychiatric: no depression, + anxiety   Objective:    BP 120/84 (BP Location: Left Arm, Patient Position: Sitting, Cuff Size: Large)   Pulse 62   Ht 5\' 7"  (1.702 m)   Wt 195 lb (88.5  kg)   BMI 30.54 kg/m   Wt Readings from Last 3 Encounters:  05/21/23 195 lb (88.5 kg)  04/21/23 197 lb (89.4 kg)  01/25/23 180 lb (81.6 kg)     BP Readings from Last 3 Encounters:  05/21/23 120/84  04/21/23 (!) 142/83  01/26/23 (!) 168/95                          Physical Exam- Limited  Constitutional:  Body mass index is 30.54 kg/m. 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) Skin:  no rashes, no hyperemia, + dry scaly plaques to legs and elbows and hair line Neurological: + tremor with outstretched hands, DTR normal in BLE   CMP     Component Value Date/Time   NA 141 01/25/2023 1905   K 3.5 01/25/2023 1905   CL 110 01/25/2023 1905   CO2 23 01/25/2023 1905   GLUCOSE 104 (H) 01/25/2023 1905   BUN 9 01/25/2023 1905   CREATININE 0.37 (L) 01/25/2023 1905   CALCIUM 9.1 01/25/2023 1905   GFRNONAA >60 01/25/2023 1905     CBC    Component Value Date/Time   WBC 6.0 01/25/2023 1905   RBC 4.14 01/26/2023 0045   RBC 4.39 01/25/2023 1905   HGB 7.8 (L) 01/25/2023 1905   HCT 27.7 (L) 01/25/2023 1905   PLT 185 01/25/2023 1905   MCV 63.1 (L) 01/25/2023 1905   MCH 17.8 (L) 01/25/2023 1905   MCHC 28.2 (L) 01/25/2023 1905   RDW 19.2 (H) 01/25/2023 1905   LYMPHSABS 1.8 12/30/2009 0720   MONOABS 0.5 12/30/2009 0720   EOSABS 0.1 12/30/2009 0720   BASOSABS 0.0 12/30/2009 0720     Diabetic Labs (most recent): No results found for: "HGBA1C", "MICROALBUR"  Lipid Panel  No results found for: "CHOL", "TRIG", "HDL", "CHOLHDL", "VLDL", "LDLCALC", "LDLDIRECT", "LABVLDL"   Lab Results  Component Value Date   TSH <0.005 (L) 05/16/2023   TSH 0.01 (A) 04/17/2023   TSH <0.010 (L) 01/25/2023   FREET4 1.76 05/16/2023   FREET4 5.12 (H)  01/26/2023     Thyroid Uptake and Scan from 04/01/23 CLINICAL DATA:  Weight loss, anxiety, heat intolerance, increased appetite. Hyperthyroidism   EXAM: THYROID SCAN AND UPTAKE - 4 AND 24 HOURS   TECHNIQUE: Following oral administration of I-123 capsule, anterior planar imaging was acquired at 24 hours. Thyroid uptake was calculated with a thyroid probe at 4-6 hours and 24 hours.   RADIOPHARMACEUTICALS:  423 uCi I-123 sodium iodide p.o.   COMPARISON:  None Available.   FINDINGS: Uniform distribution of radionuclide throughout the thyroid parenchyma. No hot or cold defects identified.   4 hour I-123 uptake = 64.2% (  normal 5-20%)   24 hour I-123 uptake = 68.1% (normal 10-30%)   IMPRESSION: Increased thyroid uptake of radioiodine in keeping with diffuse toxic goiter (Graves disease)     Electronically Signed   By: Helyn Numbers M.D.   On: 04/11/2023 20:55   Assessment & Plan:   1. Graves disease (Primary) 2. Hyperthyroidism  she is being seen at a kind request of Richardean Chimera, MD.  her history and most recent labs are reviewed, and she was examined clinically. Subjective and objective findings are consistent with thyrotoxicosis likely from primary hyperthyroidism. The potential risks of untreated thyrotoxicosis and the need for definitive therapy have been discussed in detail with her, and she agrees to proceed with diagnostic workup and treatment plan.   Will start her on Methimazole 10 mg po twice daily.  She can continue her Propanolol now.  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  Her thyroid labs are showing some improvement with the Methimazole, not yet at a point where we can start to taper off to have the RAI ablation, as discussed previously.  She is advised to continue Methimazole 10 mg po twice daily and Propanolol for symptom management.  Will recheck labs in 3 months and start tapering at that time if  appropriate.  Uptake and scan results show 4 hr uptake of 64.2% and 24 hr uptake of 68.1%, consistent with Graves Disease.        -Patient is advised to maintain close follow up with Richardean Chimera, MD for primary care needs.    I spent  31  minutes in the care of the patient today including review of labs from Thyroid Function, CMP, and other relevant labs ; imaging/biopsy records (current and previous including abstractions from other facilities); face-to-face time discussing  her lab results and symptoms, medications doses, her options of short and long term treatment based on the latest standards of care / guidelines;   and documenting the encounter.  Chinita Greenland  participated in the discussions, expressed understanding, and voiced agreement with the above plans.  All questions were answered to her satisfaction. she is encouraged to contact clinic should she have any questions or concerns prior to her return visit.  Follow up plan: Return in 3 months (on 08/18/2023) for Thyroid follow up, Previsit labs.   Thank you for involving me in the care of this pleasant patient, and I will continue to update you with her progress.    Ronny Bacon, [facility] [address] [place], [place] [address] Phone: [phone] Fax: [phone]  05/21/2023, 3:31 PM

## 2023-08-22 ENCOUNTER — Other Ambulatory Visit: Payer: Self-pay | Admitting: *Deleted

## 2023-08-22 DIAGNOSIS — E0590 Thyrotoxicosis, unspecified without thyrotoxic crisis or storm: Secondary | ICD-10-CM

## 2023-08-22 DIAGNOSIS — E0500 Thyrotoxicosis with diffuse goiter without thyrotoxic crisis or storm: Secondary | ICD-10-CM

## 2023-08-23 LAB — T3, FREE: T3, Free: 2.6 pg/mL (ref 2.0–4.4)

## 2023-08-23 LAB — T4, FREE: Free T4: 0.45 ng/dL — ABNORMAL LOW (ref 0.82–1.77)

## 2023-08-23 LAB — TSH: TSH: 6.54 u[IU]/mL — ABNORMAL HIGH (ref 0.450–4.500)

## 2023-08-26 ENCOUNTER — Ambulatory Visit: Payer: Medicaid Other | Admitting: Nurse Practitioner

## 2023-08-26 ENCOUNTER — Encounter: Payer: Self-pay | Admitting: Nurse Practitioner

## 2023-08-26 VITALS — BP 116/72 | HR 70 | Ht 67.0 in | Wt 208.0 lb

## 2023-08-26 DIAGNOSIS — E0500 Thyrotoxicosis with diffuse goiter without thyrotoxic crisis or storm: Secondary | ICD-10-CM

## 2023-08-26 DIAGNOSIS — E0590 Thyrotoxicosis, unspecified without thyrotoxic crisis or storm: Secondary | ICD-10-CM | POA: Diagnosis not present

## 2023-08-26 MED ORDER — METHIMAZOLE 10 MG PO TABS: 5.0000 mg | ORAL_TABLET | Freq: Two times a day (BID) | ORAL | Status: DC

## 2023-08-26 MED ORDER — PROPRANOLOL HCL 20 MG PO TABS
20.0000 mg | ORAL_TABLET | Freq: Two times a day (BID) | ORAL | 2 refills | Status: AC
Start: 2023-08-26 — End: ?

## 2023-08-26 NOTE — Progress Notes (Signed)
 08/26/2023     Endocrinology Follow Up Note    Subjective:    Patient ID: Katherine Huff, female    DOB: 1975/07/15, PCP Leesa Pulling, MD.   History reviewed. No pertinent past medical history.  Past Surgical History:  Procedure Laterality Date   CESAREAN SECTION     HERNIA REPAIR     ORTHOPEDIC SURGERY      Social History   Socioeconomic History   Marital status: Single    Spouse name: Not on file   Number of children: Not on file   Years of education: Not on file   Highest education level: Not on file  Occupational History   Not on file  Tobacco Use   Smoking status: Every Day    Current packs/day: 1.00    Types: Cigarettes   Smokeless tobacco: Not on file  Vaping Use   Vaping status: Never Used  Substance and Sexual Activity   Alcohol use: No   Drug use: No   Sexual activity: Not on file  Other Topics Concern   Not on file  Social History Narrative   Not on file   Social Drivers of Health   Financial Resource Strain: Not on file  Food Insecurity: Not on file  Transportation Needs: Not on file  Physical Activity: Not on file  Stress: Not on file  Social Connections: Not on file    History reviewed. No pertinent family history.  Outpatient Encounter Medications as of 08/26/2023  Medication Sig   ibuprofen  (ADVIL ) 200 MG tablet Take 200 mg by mouth every 6 (six) hours as needed for mild pain (pain score 1-3).   olmesartan (BENICAR) 20 MG tablet Take 20 mg by mouth daily.   propranolol  (INDERAL ) 20 MG tablet Take 1 tablet (20 mg total) by mouth 2 (two) times daily.   [DISCONTINUED] methimazole  (TAPAZOLE ) 10 MG tablet Take 1 tablet (10 mg total) by mouth 2 (two) times daily.   [DISCONTINUED] propranolol  ER (INDERAL  LA) 60 MG 24 hr capsule Take 1 capsule (60 mg total) by mouth daily.   amLODipine  (NORVASC ) 5 MG tablet Take 1 tablet (5 mg total) by mouth daily. (Patient not taking: Reported on 08/26/2023)   methimazole  (TAPAZOLE ) 10 MG tablet  Take 0.5 tablets (5 mg total) by mouth 2 (two) times daily.   No facility-administered encounter medications on file as of 08/26/2023.    ALLERGIES: No Known Allergies  VACCINATION STATUS:  There is no immunization history on file for this patient.   HPI  Katherine Huff is 48 y.o. female who presents today with a medical history as above. she is being seen in follow up after being seen in consultation for hyperthyroidism requested by Leesa Pulling, MD.  she has been dealing with symptoms of hair loss, sweating, palpitations, dry skin, fatigue, anxiety, weight loss, heat intolerance, insomnia, and tremors for about 2 months. These symptoms are progressively worsening and troubling to her.  her most recent thyroid  labs revealed suppressed TSH < 0.005, elevated T4 of >24.9, T3 uptake of 54, and Free Thyroxine Index of >13.4 on 04/17/23.  she denies dysphagia, choking, shortness of breath, no recent voice change.    she does have family history of thyroid  dysfunction in her maternal aunts (one has Graves, the other has Hashimotos), but denies family hx of thyroid  cancer. she is not on any anti-thyroid  medications nor on any thyroid  hormone supplements. Denies use of Biotin containing supplements.  she is willing to proceed  with appropriate work up and therapy for thyrotoxicosis.  Her PCP did put her on Propanolol to help with HR control.   Review of systems  Constitutional: + increasing body weight,  current Body mass index is 32.58 kg/m. , + fatigue, no subjective hyperthermia, no subjective hypothermia, + mental fog Eyes: no blurry vision, no xerophthalmia ENT: no sore throat, no nodules palpated in throat, no dysphagia/odynophagia, no hoarseness Cardiovascular: no chest pain, no shortness of breath, no palpitations, + leg swelling (PCP recently changed BP meds), + low HR Respiratory: no cough, no shortness of breath Gastrointestinal: no nausea/vomiting/diarrhea Musculoskeletal: +  muscle/joint aches Skin: no rashes, no hyperemia Neurological: no tremors, no numbness, no tingling, no dizziness Psychiatric: + depression- very tearful recently, no anxiety   Objective:    BP 116/72 (BP Location: Right Arm, Patient Position: Sitting, Cuff Size: Large)   Pulse 70   Ht 5\' 7"  (1.702 m)   Wt 208 lb (94.3 kg)   LMP 07/13/2023 (Approximate)   BMI 32.58 kg/m   Wt Readings from Last 3 Encounters:  08/26/23 208 lb (94.3 kg)  05/21/23 195 lb (88.5 kg)  04/21/23 197 lb (89.4 kg)     BP Readings from Last 3 Encounters:  08/26/23 116/72  05/21/23 120/84  04/21/23 (!) 142/83     Physical Exam- Limited  Constitutional:  Body mass index is 32.58 kg/m. , not in acute distress, very tearful today Eyes:  EOMI, no exophthalmos Musculoskeletal: no gross deformities, strength intact in all four extremities, no gross restriction of joint movements Skin:  no rashes, no hyperemia Neurological: no tremor with outstretched hands   CMP     Component Value Date/Time   NA 141 01/25/2023 1905   K 3.5 01/25/2023 1905   CL 110 01/25/2023 1905   CO2 23 01/25/2023 1905   GLUCOSE 104 (H) 01/25/2023 1905   BUN 9 01/25/2023 1905   CREATININE 0.37 (L) 01/25/2023 1905   CALCIUM 9.1 01/25/2023 1905   GFRNONAA >60 01/25/2023 1905     CBC    Component Value Date/Time   WBC 6.0 01/25/2023 1905   RBC 4.14 01/26/2023 0045   RBC 4.39 01/25/2023 1905   HGB 7.8 (L) 01/25/2023 1905   HCT 27.7 (L) 01/25/2023 1905   PLT 185 01/25/2023 1905   MCV 63.1 (L) 01/25/2023 1905   MCH 17.8 (L) 01/25/2023 1905   MCHC 28.2 (L) 01/25/2023 1905   RDW 19.2 (H) 01/25/2023 1905   LYMPHSABS 1.8 12/30/2009 0720   MONOABS 0.5 12/30/2009 0720   EOSABS 0.1 12/30/2009 0720   BASOSABS 0.0 12/30/2009 0720     Diabetic Labs (most recent): No results found for: "HGBA1C", "MICROALBUR"  Lipid Panel  No results found for: "CHOL", "TRIG", "HDL", "CHOLHDL", "VLDL", "LDLCALC", "LDLDIRECT",  "LABVLDL"   Lab Results  Component Value Date   TSH 6.540 (H) 08/22/2023   TSH <0.005 (L) 05/16/2023   TSH 0.01 (A) 04/17/2023   TSH <0.010 (L) 01/25/2023   FREET4 0.45 (L) 08/22/2023   FREET4 1.76 05/16/2023   FREET4 5.12 (H) 01/26/2023     Thyroid  Uptake and Scan from 04/01/23 CLINICAL DATA:  Weight loss, anxiety, heat intolerance, increased appetite. Hyperthyroidism   EXAM: THYROID  SCAN AND UPTAKE - 4 AND 24 HOURS   TECHNIQUE: Following oral administration of I-123 capsule, anterior planar imaging was acquired at 24 hours. Thyroid  uptake was calculated with a thyroid  probe at 4-6 hours and 24 hours.   RADIOPHARMACEUTICALS:  423 uCi I-123 sodium iodide p.o.  COMPARISON:  None Available.   FINDINGS: Uniform distribution of radionuclide throughout the thyroid  parenchyma. No hot or cold defects identified.   4 hour I-123 uptake = 64.2% (normal 5-20%)   24 hour I-123 uptake = 68.1% (normal 10-30%)   IMPRESSION: Increased thyroid  uptake of radioiodine in keeping with diffuse toxic goiter (Graves disease)     Electronically Signed   By: Worthy Heads M.D.   On: 04/11/2023 20:55   Latest Reference Range & Units 01/25/23 19:05 01/26/23 00:45 04/17/23 00:00 05/16/23 10:50 05/16/23 10:53 08/22/23 09:27  TSH 0.450 - 4.500 uIU/mL <0.010 (L)  0.01 ! (E)  <0.005 (L) 6.540 (H)  Triiodothyronine,Free,Serum 2.0 - 4.4 pg/mL     5.5 (H) 2.6  T4,Free(Direct) 0.82 - 1.77 ng/dL  1.61 (H)   0.96 0.45 (L)  Thyroperoxidase Ab SerPl-aCnc 0 - 34 IU/mL    152 (H)    Thyroglobulin Antibody 0.0 - 0.9 IU/mL    <1.0    (L): Data is abnormally low !: Data is abnormal (H): Data is abnormally high (E): External lab result Assessment & Plan:   1. Graves disease (Primary) 2. Hyperthyroidism  she is being seen at a kind request of Leesa Pulling, MD.  her history and most recent labs are reviewed, and she was examined clinically. Subjective and objective findings are consistent with  thyrotoxicosis likely from primary hyperthyroidism. The potential risks of untreated thyrotoxicosis and the need for definitive therapy have been discussed in detail with her, and she agrees to proceed with diagnostic workup and treatment plan.   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  Her thyroid  labs are showing great improvement with the Methimazole , so much so, that we can begin to taper her down and ultimately get the RAI ablation.  She is advised to lower Methimazole  to 5 mg po twice daily for now, repeat labs in 8 weeks to determine if we can taper further.  Will call patient with results and next steps.  Uptake and scan results show 4 hr uptake of 64.2% and 24 hr uptake of 68.1%, consistent with Graves Disease.        -Patient is advised to maintain close follow up with Leesa Pulling, MD for primary care needs.   I spent  38  minutes in the care of the patient today including review of labs from Thyroid  Function, CMP, and other relevant labs ; imaging/biopsy records (current and previous including abstractions from other facilities); face-to-face time discussing  her lab results and symptoms, medications doses, her options of short and long term treatment based on the latest standards of care / guidelines;   and documenting the encounter.  Noralee Beam  participated in the discussions, expressed understanding, and voiced agreement with the above plans.  All questions were answered to her satisfaction. she is encouraged to contact clinic should she have any questions or concerns prior to her return visit.  Follow up plan: Return labs in 8 weeks (beginning of August), will call with results and next steps.   Thank you for involving me in the care of this pleasant patient, and I will continue to update you with her progress.    Hulon Magic, [facility] [address] [place], [place] [address] Phone:  [phone] Fax: [phone]  08/26/2023, 11:02 AM

## 2023-09-10 ENCOUNTER — Other Ambulatory Visit: Payer: Self-pay | Admitting: Nurse Practitioner

## 2023-09-10 DIAGNOSIS — E0590 Thyrotoxicosis, unspecified without thyrotoxic crisis or storm: Secondary | ICD-10-CM

## 2023-09-10 DIAGNOSIS — E0500 Thyrotoxicosis with diffuse goiter without thyrotoxic crisis or storm: Secondary | ICD-10-CM

## 2023-09-18 ENCOUNTER — Telehealth: Payer: Self-pay | Admitting: *Deleted

## 2023-09-18 NOTE — Telephone Encounter (Signed)
 Patient left a voicemail stating that she was having Chest Pain, Chest Pressure, and a shooting pain in her Chest. She has been taking her BP and it is high. Patient had also called the Access Nurse last night at 11:39 PM. She shared the same symptoms with her. Patient was advised to call 911 and go to the ED for further evaluation. Patient declined.  Patient was last seen here on 08/26/2023. She was being seen for Graves Disease , Hyperthyroidism. At this visit , patient was advised to lower her Methimazole  5 mg to twice a day. This taper is to get the patient ready for a RAI Ablation. She is to repeat labs/OV in 8 weeks.  Shared with Whitney the patient's symptoms , she recommends that the patient go to the ED for a further evaluation. Patient was called and made aware. She states that she will call her mother to transport her to [facility] ED. Declined the offer to call 911.

## 2023-11-03 ENCOUNTER — Other Ambulatory Visit: Payer: Self-pay | Admitting: Nurse Practitioner

## 2023-11-03 DIAGNOSIS — E0500 Thyrotoxicosis with diffuse goiter without thyrotoxic crisis or storm: Secondary | ICD-10-CM

## 2023-11-03 DIAGNOSIS — E0590 Thyrotoxicosis, unspecified without thyrotoxic crisis or storm: Secondary | ICD-10-CM

## 2023-11-05 ENCOUNTER — Ambulatory Visit: Payer: Self-pay | Admitting: Nurse Practitioner

## 2023-11-05 DIAGNOSIS — E0590 Thyrotoxicosis, unspecified without thyrotoxic crisis or storm: Secondary | ICD-10-CM

## 2023-11-05 DIAGNOSIS — E0500 Thyrotoxicosis with diffuse goiter without thyrotoxic crisis or storm: Secondary | ICD-10-CM

## 2023-11-05 LAB — TSH: TSH: 10.8 u[IU]/mL — ABNORMAL HIGH (ref 0.450–4.500)

## 2023-11-05 LAB — T3, FREE: T3, Free: 3.3 pg/mL (ref 2.0–4.4)

## 2023-11-05 LAB — T4, FREE: Free T4: 0.77 ng/dL — ABNORMAL LOW (ref 0.82–1.77)

## 2023-11-05 NOTE — Progress Notes (Signed)
 Please schedule patient for follow up in office in 8 weeks with previsit labs.

## 2023-11-07 NOTE — Telephone Encounter (Signed)
 See patient response.

## 2023-11-10 NOTE — Telephone Encounter (Signed)
 See patient inquiry about f/u appt

## 2024-01-09 ENCOUNTER — Ambulatory Visit (INDEPENDENT_AMBULATORY_CARE_PROVIDER_SITE_OTHER): Admitting: Nurse Practitioner

## 2024-01-09 ENCOUNTER — Encounter: Payer: Self-pay | Admitting: Nurse Practitioner

## 2024-01-09 VITALS — BP 112/84 | HR 77 | Ht 67.0 in | Wt 232.6 lb

## 2024-01-09 DIAGNOSIS — E0500 Thyrotoxicosis with diffuse goiter without thyrotoxic crisis or storm: Secondary | ICD-10-CM

## 2024-01-09 DIAGNOSIS — E0590 Thyrotoxicosis, unspecified without thyrotoxic crisis or storm: Secondary | ICD-10-CM

## 2024-01-09 LAB — T4, FREE: Free T4: 0.91 ng/dL (ref 0.82–1.77)

## 2024-01-09 LAB — TSH: TSH: 4.33 u[IU]/mL (ref 0.450–4.500)

## 2024-01-09 LAB — T3, FREE: T3, Free: 3.4 pg/mL (ref 2.0–4.4)

## 2024-01-09 MED ORDER — PROPRANOLOL HCL 20 MG PO TABS: 20.0000 mg | ORAL_TABLET | Freq: Two times a day (BID) | ORAL | 2 refills | Status: DC

## 2024-01-09 NOTE — Progress Notes (Signed)
 01/09/2024     Endocrinology Follow Up Note    Subjective:    Patient ID: Katherine Huff, female    DOB: 12-May-1975, PCP Toribio Jerel MATSU, MD.   History reviewed. No pertinent past medical history.  Past Surgical History:  Procedure Laterality Date   CESAREAN SECTION     HERNIA REPAIR     ORTHOPEDIC SURGERY      Social History   Socioeconomic History   Marital status: Single    Spouse name: Not on file   Number of children: Not on file   Years of education: Not on file   Highest education level: Not on file  Occupational History   Not on file  Tobacco Use   Smoking status: Every Day    Current packs/day: 1.00    Types: Cigarettes   Smokeless tobacco: Not on file  Vaping Use   Vaping status: Never Used  Substance and Sexual Activity   Alcohol use: No   Drug use: No   Sexual activity: Not on file  Other Topics Concern   Not on file  Social History Narrative   Not on file   Social Drivers of Health   Financial Resource Strain: Not on file  Food Insecurity: Not on file  Transportation Needs: Not on file  Physical Activity: Not on file  Stress: Not on file  Social Connections: Not on file    History reviewed. No pertinent family history.  Outpatient Encounter Medications as of 01/09/2024  Medication Sig   ibuprofen  (ADVIL ) 200 MG tablet Take 200 mg by mouth every 6 (six) hours as needed for mild pain (pain score 1-3).   olmesartan (BENICAR) 20 MG tablet Take 20 mg by mouth daily.   [DISCONTINUED] methimazole  (TAPAZOLE ) 10 MG tablet Take 0.5 tablets (5 mg total) by mouth 2 (two) times daily. (Patient taking differently: Take 5 mg by mouth daily.)   [DISCONTINUED] propranolol  (INDERAL ) 20 MG tablet Take 1 tablet (20 mg total) by mouth 2 (two) times daily.   propranolol  (INDERAL ) 20 MG tablet Take 1 tablet (20 mg total) by mouth 2 (two) times daily.   [DISCONTINUED] amLODipine  (NORVASC ) 5 MG tablet Take 1 tablet (5 mg total) by mouth daily. (Patient  not taking: Reported on 01/09/2024)   No facility-administered encounter medications on file as of 01/09/2024.    ALLERGIES: No Known Allergies  VACCINATION STATUS:  There is no immunization history on file for this patient.   HPI  Katherine Huff is 48 y.o. female who presents today with a medical history as above. she is being seen in follow up after being seen in consultation for hyperthyroidism requested by Toribio Jerel MATSU, MD.  she has been dealing with symptoms of hair loss, sweating, palpitations, dry skin, fatigue, anxiety, weight loss, heat intolerance, insomnia, and tremors for about 2 months. These symptoms are progressively worsening and troubling to her.  her most recent thyroid  labs revealed suppressed TSH < 0.005, elevated T4 of >24.9, T3 uptake of 54, and Free Thyroxine Index of >13.4 on 04/17/23.  she denies dysphagia, choking, shortness of breath, no recent voice change.    she does have family history of thyroid  dysfunction in her maternal aunts (one has Graves, the other has Hashimotos), but denies family hx of thyroid  cancer. she is not on any anti-thyroid  medications nor on any thyroid  hormone supplements. Denies use of Biotin containing supplements.  she is willing to proceed with appropriate work up and therapy for thyrotoxicosis.  Her  PCP did put her on Propanolol to help with HR control.   Review of systems  Constitutional: + rapidly increasing body weight,  current Body mass index is 36.43 kg/m. , + fatigue, + subjective hyperthermia-mostly at night, no subjective hypothermia, + mental fog Eyes: no blurry vision, no xerophthalmia ENT: no sore throat, no nodules palpated in throat, no dysphagia/odynophagia, no hoarseness Cardiovascular: no chest pain, no shortness of breath, no palpitations, + leg swelling (PCP recently changed BP meds), + low HR Respiratory: no cough, no shortness of breath Gastrointestinal: no nausea/vomiting/diarrhea Musculoskeletal: +  muscle/joint aches Skin: no rashes, no hyperemia, + dry skin Neurological: no tremors, no numbness, no tingling, no dizziness Psychiatric: + depression- very tearful recently, no anxiety   Objective:    BP 112/84 (BP Location: Left Arm, Patient Position: Sitting, Cuff Size: Large)   Pulse 77   Ht 5' 7 (1.702 m)   Wt 232 lb 9.6 oz (105.5 kg)   BMI 36.43 kg/m   Wt Readings from Last 3 Encounters:  01/09/24 232 lb 9.6 oz (105.5 kg)  08/26/23 208 lb (94.3 kg)  05/21/23 195 lb (88.5 kg)     BP Readings from Last 3 Encounters:  01/09/24 112/84  08/26/23 116/72  05/21/23 120/84     Physical Exam- Limited  Constitutional:  Body mass index is 36.43 kg/m. , not in acute distress, tearful today Eyes:  EOMI, no exophthalmos Musculoskeletal: no gross deformities, strength intact in all four extremities, no gross restriction of joint movements Skin:  no rashes, no hyperemia Neurological: no tremor with outstretched hands   CMP     Component Value Date/Time   NA 141 01/25/2023 1905   K 3.5 01/25/2023 1905   CL 110 01/25/2023 1905   CO2 23 01/25/2023 1905   GLUCOSE 104 (H) 01/25/2023 1905   BUN 9 01/25/2023 1905   CREATININE 0.37 (L) 01/25/2023 1905   CALCIUM 9.1 01/25/2023 1905   GFRNONAA >60 01/25/2023 1905     CBC    Component Value Date/Time   WBC 6.0 01/25/2023 1905   RBC 4.14 01/26/2023 0045   RBC 4.39 01/25/2023 1905   HGB 7.8 (L) 01/25/2023 1905   HCT 27.7 (L) 01/25/2023 1905   PLT 185 01/25/2023 1905   MCV 63.1 (L) 01/25/2023 1905   MCH 17.8 (L) 01/25/2023 1905   MCHC 28.2 (L) 01/25/2023 1905   RDW 19.2 (H) 01/25/2023 1905   LYMPHSABS 1.8 12/30/2009 0720   MONOABS 0.5 12/30/2009 0720   EOSABS 0.1 12/30/2009 0720   BASOSABS 0.0 12/30/2009 0720     Diabetic Labs (most recent): No results found for: HGBA1C, MICROALBUR  Lipid Panel  No results found for: CHOL, TRIG, HDL, CHOLHDL, VLDL, LDLCALC, LDLDIRECT, LABVLDL   Lab Results   Component Value Date   TSH 4.330 01/08/2024   TSH 10.800 (H) 11/04/2023   TSH 6.540 (H) 08/22/2023   TSH <0.005 (L) 05/16/2023   TSH 0.01 (A) 04/17/2023   TSH <0.010 (L) 01/25/2023   FREET4 0.91 01/08/2024   FREET4 0.77 (L) 11/04/2023   FREET4 0.45 (L) 08/22/2023   FREET4 1.76 05/16/2023   FREET4 5.12 (H) 01/26/2023     Thyroid  Uptake and Scan from 04/01/23 CLINICAL DATA:  Weight loss, anxiety, heat intolerance, increased appetite. Hyperthyroidism   EXAM: THYROID  SCAN AND UPTAKE - 4 AND 24 HOURS   TECHNIQUE: Following oral administration of I-123 capsule, anterior planar imaging was acquired at 24 hours. Thyroid  uptake was calculated with a thyroid  probe at 4-6  hours and 24 hours.   RADIOPHARMACEUTICALS:  423 uCi I-123 sodium iodide p.o.   COMPARISON:  None Available.   FINDINGS: Uniform distribution of radionuclide throughout the thyroid  parenchyma. No hot or cold defects identified.   4 hour I-123 uptake = 64.2% (normal 5-20%)   24 hour I-123 uptake = 68.1% (normal 10-30%)   IMPRESSION: Increased thyroid  uptake of radioiodine in keeping with diffuse toxic goiter (Graves disease)     Electronically Signed   By: Dorethia Molt M.D.   On: 04/11/2023 20:55   Latest Reference Range & Units 05/16/23 10:53 08/22/23 09:27 11/04/23 14:01 01/08/24 12:08  TSH 0.450 - 4.500 uIU/mL <0.005 (L) 6.540 (H) 10.800 (H) 4.330  Triiodothyronine,Free,Serum 2.0 - 4.4 pg/mL 5.5 (H) 2.6 3.3 3.4  T4,Free(Direct) 0.82 - 1.77 ng/dL 8.23 9.54 (L) 9.22 (L) 0.91  (L): Data is abnormally low (H): Data is abnormally high Assessment & Plan:   1. Graves disease (Primary) 2. Hyperthyroidism  she is being seen at a kind request of Toribio Jerel MATSU, MD.  her history and most recent labs are reviewed, and she was examined clinically. Subjective and objective findings are consistent with thyrotoxicosis likely from primary hyperthyroidism. The potential risks of untreated thyrotoxicosis and the  need for definitive therapy have been discussed in detail with her, and she agrees to proceed with diagnostic workup and treatment plan.   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  Uptake and scan results show 4 hr uptake of 64.2% and 24 hr uptake of 68.1%, consistent with Graves Disease.   Her repeat thyroid  labs show stability, now able to stop the Methimazole  and proceed with RAI ablation.  She is aware she has to be off the Methimazole  for 5 full days prior to having the treatment to prevent interference.  Will plan to recheck labs 6 weeks after RAI ablation to assess response to treatment and help determine timing of thyroid  hormone initiation.     -Patient is advised to maintain close follow up with Toribio Jerel MATSU, MD for primary care needs.    I spent  23  minutes in the care of the patient today including review of labs from Thyroid  Function, CMP, and other relevant labs ; imaging/biopsy records (current and previous including abstractions from other facilities); face-to-face time discussing  her lab results and symptoms, medications doses, her options of short and long term treatment based on the latest standards of care / guidelines;   and documenting the encounter.  Clotilda Darner  participated in the discussions, expressed understanding, and voiced agreement with the above plans.  All questions were answered to her satisfaction. she is encouraged to contact clinic should she have any questions or concerns prior to her return visit.  Follow up plan: Return in about 8 weeks (around 03/05/2024) for Thyroid  follow up, Previsit labs.   Thank you for involving me in the care of this pleasant patient, and I will continue to update you with her progress.   Benton Rio, [facility] [address] [place], [place] [address] Phone: [phone] Fax: [phone]  01/09/2024, 10:36 AM

## 2024-01-12 NOTE — Written Directive (Cosign Needed)
  I-131 WHOLE THYROID  THERAPY (NON-CANCER)    RADIOPHARMACEUTICAL:   Iodine-131 Capsule    PRESCRIBED DOSE FOR ADMINISTRATION: 16 mCi   ROUTE OFADMINISTRATION: PO   DIAGNOSIS:  Hyperthyroidism   REFERRING PHYSICIAN: Nida   TSH:    Lab Results  Component Value Date   TSH 4.330 01/08/2024   TSH 10.800 (H) 11/04/2023   TSH 6.540 (H) 08/22/2023     PRIOR I-131 THERAPY (Date and Dose):   PRIOR RADIOLOGY EXAMS (Results and Date): NM THYROID  MULT UPTAKE W/IMAGING Result Date: 04/11/2023 CLINICAL DATA:  Weight loss, anxiety, heat intolerance, increased appetite. Hyperthyroidism EXAM: THYROID  SCAN AND UPTAKE - 4 AND 24 HOURS TECHNIQUE: Following oral administration of I-123 capsule, anterior planar imaging was acquired at 24 hours. Thyroid  uptake was calculated with a thyroid  probe at 4-6 hours and 24 hours. RADIOPHARMACEUTICALS:  423 uCi I-123 sodium iodide p.o. COMPARISON:  None Available. FINDINGS: Uniform distribution of radionuclide throughout the thyroid  parenchyma. No hot or cold defects identified. 4 hour I-123 uptake = 64.2% (normal 5-20%) 24 hour I-123 uptake = 68.1% (normal 10-30%) IMPRESSION: Increased thyroid  uptake of radioiodine in keeping with diffuse toxic goiter (Graves disease) Electronically Signed   By: Dorethia Molt M.D.   On: 04/11/2023 20:55      ADDITIONAL PHYSICIAN COMMENTS/NOTES  Weight loss, anxiety, heat intolerance, increased appetite. Hyperthyroidism  - Graves disease...  AUTHORIZED USER SIGNATURE & TIME STAMP: Norleen GORMAN Boxer, MD   01/13/24    8:42 AM

## 2024-01-13 ENCOUNTER — Encounter (HOSPITAL_COMMUNITY): Payer: Self-pay | Admitting: Nurse Practitioner

## 2024-01-16 ENCOUNTER — Other Ambulatory Visit: Payer: Self-pay | Admitting: Nurse Practitioner

## 2024-01-16 ENCOUNTER — Ambulatory Visit (HOSPITAL_COMMUNITY)
Admission: RE | Admit: 2024-01-16 | Discharge: 2024-01-16 | Disposition: A | Discharge status: Home / Self Care | Source: Ambulatory Visit | Attending: Nurse Practitioner | Admitting: Nurse Practitioner

## 2024-01-16 ENCOUNTER — Encounter (HOSPITAL_COMMUNITY): Payer: Self-pay

## 2024-01-16 ENCOUNTER — Other Ambulatory Visit (HOSPITAL_COMMUNITY)
Admission: RE | Admit: 2024-01-16 | Discharge: 2024-01-16 | Disposition: A | Discharge status: Home / Self Care | Source: Ambulatory Visit | Attending: Nurse Practitioner | Admitting: Nurse Practitioner

## 2024-01-16 DIAGNOSIS — E0500 Thyrotoxicosis with diffuse goiter without thyrotoxic crisis or storm: Secondary | ICD-10-CM | POA: Diagnosis not present

## 2024-01-16 DIAGNOSIS — E0590 Thyrotoxicosis, unspecified without thyrotoxic crisis or storm: Secondary | ICD-10-CM

## 2024-01-16 LAB — PREGNANCY, URINE: Preg Test, Ur: NEGATIVE

## 2024-01-16 MED ORDER — SODIUM IODIDE I 131 CAPSULE
16.0000 | Freq: Once | INTRAVENOUS | Status: AC | PRN
  Administered 2024-01-16: 15.2 via ORAL

## 2024-01-26 ENCOUNTER — Encounter: Payer: Self-pay | Admitting: Nurse Practitioner

## 2024-03-08 ENCOUNTER — Ambulatory Visit: Admitting: Nurse Practitioner

## 2024-03-09 LAB — T4, FREE: Free T4: 3.78 ng/dL — ABNORMAL HIGH (ref 0.82–1.77)

## 2024-03-09 LAB — T3, FREE: T3, Free: 10.6 pg/mL — ABNORMAL HIGH (ref 2.0–4.4)

## 2024-03-09 LAB — TSH: TSH: <0.005 u[IU]/mL — ABNORMAL LOW (ref 0.450–4.500)

## 2024-03-10 ENCOUNTER — Ambulatory Visit (INDEPENDENT_AMBULATORY_CARE_PROVIDER_SITE_OTHER): Admitting: Nurse Practitioner

## 2024-03-10 ENCOUNTER — Encounter: Payer: Self-pay | Admitting: Nurse Practitioner

## 2024-03-10 VITALS — BP 128/74 | HR 68 | Ht 67.0 in | Wt 234.2 lb

## 2024-03-10 DIAGNOSIS — E0590 Thyrotoxicosis, unspecified without thyrotoxic crisis or storm: Secondary | ICD-10-CM

## 2024-03-10 DIAGNOSIS — E0500 Thyrotoxicosis with diffuse goiter without thyrotoxic crisis or storm: Secondary | ICD-10-CM | POA: Diagnosis not present

## 2024-03-10 NOTE — Progress Notes (Signed)
 03/10/2024     Endocrinology Follow Up Note    Subjective:    Patient ID: Katherine Huff, female    DOB: 09/21/1975, PCP Toribio Jerel MATSU, MD.   History reviewed. No pertinent past medical history.  Past Surgical History:  Procedure Laterality Date   CESAREAN SECTION     HERNIA REPAIR     ORTHOPEDIC SURGERY      Social History   Socioeconomic History   Marital status: Single    Spouse name: Not on file   Number of children: Not on file   Years of education: Not on file   Highest education level: Not on file  Occupational History   Not on file  Tobacco Use   Smoking status: Every Day    Current packs/day: 1.00    Types: Cigarettes   Smokeless tobacco: Not on file  Vaping Use   Vaping status: Never Used  Substance and Sexual Activity   Alcohol use: No   Drug use: No   Sexual activity: Not on file  Other Topics Concern   Not on file  Social History Narrative   Not on file   Social Drivers of Health   Tobacco Use: High Risk (03/10/2024)   Patient History    Smoking Tobacco Use: Every Day    Smokeless Tobacco Use: Unknown    Passive Exposure: Not on file  Financial Resource Strain: Not on file  Food Insecurity: Not on file  Transportation Needs: Not on file  Physical Activity: Not on file  Stress: Not on file  Social Connections: Not on file  Depression (EYV7-0): Not on file  Alcohol Screen: Not on file  Housing: Not on file  Utilities: Not on file  Health Literacy: Not on file    History reviewed. No pertinent family history.  Outpatient Encounter Medications as of 03/10/2024  Medication Sig   ibuprofen  (ADVIL ) 200 MG tablet Take 200 mg by mouth every 6 (six) hours as needed for mild pain (pain score 1-3).   methimazole  (TAPAZOLE ) 10 MG tablet Take 1 tablet (10 mg total) by mouth 3 (three) times daily.   olmesartan (BENICAR) 20 MG tablet Take 20 mg by mouth daily.   propranolol  (INDERAL ) 20 MG tablet Take 1 tablet (20 mg total) by mouth  2 (two) times daily.   [DISCONTINUED] predniSONE (DELTASONE) 20 MG tablet Take 20 mg by mouth 2 (two) times daily. (Patient not taking: Reported on 03/10/2024)   No facility-administered encounter medications on file as of 03/10/2024.    ALLERGIES: No Known Allergies  VACCINATION STATUS:  There is no immunization history on file for this patient.   HPI  Katherine Huff is 48 y.o. female who presents today with a medical history as above. she is being seen in follow up after being seen in consultation for hyperthyroidism requested by Toribio Jerel MATSU, MD.  she has been dealing with symptoms of hair loss, sweating, palpitations, dry skin, fatigue, anxiety, weight loss, heat intolerance, insomnia, and tremors for about 2 months. These symptoms are progressively worsening and troubling to her.  her most recent thyroid  labs revealed suppressed TSH < 0.005, elevated T4 of >24.9, T3 uptake of 54, and Free Thyroxine Index of >13.4 on 04/17/23.  she denies dysphagia, choking, shortness of breath, no recent voice change.    she does have family history of thyroid  dysfunction in her maternal aunts (one has Graves, the other has Hashimotos), but denies family hx of thyroid  cancer. she is not on  any anti-thyroid  medications nor on any thyroid  hormone supplements. Denies use of Biotin containing supplements.  she is willing to proceed with appropriate work up and therapy for thyrotoxicosis.  Her PCP did put her on Propanolol to help with HR control.   Review of systems  Constitutional: + minimally fluctuating body weight,  current Body mass index is 36.68 kg/m. , + fatigue, + subjective hyperthermia-mostly at night, no subjective hypothermia, + mental fog Eyes: no blurry vision, no xerophthalmia, + dry eyes, increased pressure behind eyes ENT: no sore throat, no nodules palpated in throat, no dysphagia/odynophagia, no hoarseness Cardiovascular: no chest pain, no shortness of breath, no  palpitations Respiratory: no cough, no shortness of breath Gastrointestinal: no nausea/vomiting/diarrhea Musculoskeletal: + muscle/joint aches Skin: no rashes, no hyperemia, + dry skin Neurological: no tremors, no numbness, no tingling, no dizziness Psychiatric: + depression, + anxiety   Objective:    BP 128/74 (BP Location: Left Arm, Patient Position: Sitting, Cuff Size: Large)   Pulse 68   Ht 5' 7 (1.702 m)   Wt 234 lb 3.2 oz (106.2 kg)   BMI 36.68 kg/m   Wt Readings from Last 3 Encounters:  03/10/24 234 lb 3.2 oz (106.2 kg)  01/09/24 232 lb 9.6 oz (105.5 kg)  08/26/23 208 lb (94.3 kg)     BP Readings from Last 3 Encounters:  03/10/24 128/74  01/09/24 112/84  08/26/23 116/72     Physical Exam- Limited  Constitutional:  Body mass index is 36.68 kg/m. , not in acute distress, normal state of mind Eyes:  EOMI, no exophthalmos Musculoskeletal: no gross deformities, strength intact in all four extremities, no gross restriction of joint movements Skin:  no rashes, no hyperemia Neurological: no tremor with outstretched hands   CMP     Component Value Date/Time   NA 141 01/25/2023 1905   K 3.5 01/25/2023 1905   CL 110 01/25/2023 1905   CO2 23 01/25/2023 1905   GLUCOSE 104 (H) 01/25/2023 1905   BUN 9 01/25/2023 1905   CREATININE 0.37 (L) 01/25/2023 1905   CALCIUM 9.1 01/25/2023 1905   GFRNONAA >60 01/25/2023 1905     CBC    Component Value Date/Time   WBC 6.0 01/25/2023 1905   RBC 4.14 01/26/2023 0045   RBC 4.39 01/25/2023 1905   HGB 7.8 (L) 01/25/2023 1905   HCT 27.7 (L) 01/25/2023 1905   PLT 185 01/25/2023 1905   MCV 63.1 (L) 01/25/2023 1905   MCH 17.8 (L) 01/25/2023 1905   MCHC 28.2 (L) 01/25/2023 1905   RDW 19.2 (H) 01/25/2023 1905   LYMPHSABS 1.8 12/30/2009 0720   MONOABS 0.5 12/30/2009 0720   EOSABS 0.1 12/30/2009 0720   BASOSABS 0.0 12/30/2009 0720     Diabetic Labs (most recent): No results found for: HGBA1C, MICROALBUR  Lipid Panel   No results found for: CHOL, TRIG, HDL, CHOLHDL, VLDL, LDLCALC, LDLDIRECT, LABVLDL   Lab Results  Component Value Date   TSH <0.005 (L) 03/08/2024   TSH 4.330 01/08/2024   TSH 10.800 (H) 11/04/2023   TSH 6.540 (H) 08/22/2023   TSH <0.005 (L) 05/16/2023   TSH 0.01 (A) 04/17/2023   TSH <0.010 (L) 01/25/2023   FREET4 3.78 (H) 03/08/2024   FREET4 0.91 01/08/2024   FREET4 0.77 (L) 11/04/2023   FREET4 0.45 (L) 08/22/2023   FREET4 1.76 05/16/2023   FREET4 5.12 (H) 01/26/2023     Thyroid  Uptake and Scan from 04/01/23 CLINICAL DATA:  Weight loss, anxiety, heat intolerance, increased appetite.  Hyperthyroidism   EXAM: THYROID  SCAN AND UPTAKE - 4 AND 24 HOURS   TECHNIQUE: Following oral administration of I-123 capsule, anterior planar imaging was acquired at 24 hours. Thyroid  uptake was calculated with a thyroid  probe at 4-6 hours and 24 hours.   RADIOPHARMACEUTICALS:  423 uCi I-123 sodium iodide p.o.   COMPARISON:  None Available.   FINDINGS: Uniform distribution of radionuclide throughout the thyroid  parenchyma. No hot or cold defects identified.   4 hour I-123 uptake = 64.2% (normal 5-20%)   24 hour I-123 uptake = 68.1% (normal 10-30%)   IMPRESSION: Increased thyroid  uptake of radioiodine in keeping with diffuse toxic goiter (Graves disease)     Electronically Signed   By: Dorethia Molt M.D.   On: 04/11/2023 20:55   Latest Reference Range & Units 05/16/23 10:53 08/22/23 09:27 11/04/23 14:01 01/08/24 12:08 03/08/24 09:31  TSH 0.450 - 4.500 uIU/mL <0.005 (L) 6.540 (H) 10.800 (H) 4.330 <0.005 (L)  Triiodothyronine,Free,Serum 2.0 - 4.4 pg/mL 5.5 (H) 2.6 3.3 3.4 10.6 (H)  T4,Free(Direct) 0.82 - 1.77 ng/dL 8.23 9.54 (L) 9.22 (L) 0.91 3.78 (H)  (L): Data is abnormally low (H): Data is abnormally high Assessment & Plan:   1. Graves disease (Primary) 2. Hyperthyroidism  she is being seen at a kind request of Toribio Jerel MATSU, MD.  her history and most  recent labs are reviewed, and she was examined clinically. Subjective and objective findings are consistent with thyrotoxicosis likely from primary hyperthyroidism. The potential risks of untreated thyrotoxicosis and the need for definitive therapy have been discussed in detail with her, and she agrees to proceed with diagnostic workup and treatment plan.   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  Uptake and scan results show 4 hr uptake of 64.2% and 24 hr uptake of 68.1%, consistent with Graves Disease.   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  Her follow up thyroid  labs have reverted back to overactive (not uncommon as typically we will have rush of thyroid  hormone following RAI ablation) but she is also having return of her over-active symptoms.  Will restart Methimazole  10 mg po daily for now to help slow things down in the interim.  She can continue Propanolol as well for now.  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  Will repeat labs in 6 weeks and follow up in office.   -Patient is advised to maintain close follow up with Toribio Jerel MATSU, MD for primary care needs.    I spent  14  minutes in the care of the patient today including review of labs from Thyroid  Function, CMP, and other relevant labs ; imaging/biopsy records (current and previous including abstractions from other facilities); face-to-face time discussing  her lab results and symptoms, medications doses, her options of short and long term treatment based on the latest standards of care / guidelines;   and documenting the encounter.  Clotilda Darner  participated in the discussions, expressed understanding, and voiced agreement with the above plans.  All questions were answered to her satisfaction. she is encouraged to contact clinic should she have any questions or concerns prior to her return visit.  Follow up plan: Return in  about 6 weeks (around 04/21/2024) for Thyroid  follow up, Previsit labs.   Thank you for involving me in the care of this pleasant patient, and I will continue to update you with her progress.  Benton Rio, FNP-BC [facility] [address] [place], [place] [address] Phone: [phone] Fax: [phone]  03/10/2024, 11:24 AM

## 2024-04-15 ENCOUNTER — Ambulatory Visit

## 2024-04-15 DIAGNOSIS — M6281 Muscle weakness (generalized): Secondary | ICD-10-CM | POA: Diagnosis present

## 2024-04-15 DIAGNOSIS — M5459 Other low back pain: Secondary | ICD-10-CM | POA: Insufficient documentation

## 2024-04-15 NOTE — Therapy (Signed)
 " OUTPATIENT PHYSICAL THERAPY EVALUATION   Patient Name: Katherine Huff MRN: [id number] DOB:27-Jun-1975, 49 y.o., female Today's Date: 04/15/2024  END OF SESSION:  PT End of Session - 04/15/24 1306     Visit Number 1    Number of Visits 24    Date for Recertification  07/08/24    Authorization Type [employer]    PT Start Time 1306    PT Stop Time 1345    PT Time Calculation (min) 39 min    Activity Tolerance Patient tolerated treatment well    Behavior During Therapy [facility] for tasks assessed/performed          History reviewed. No pertinent past medical history. Past Surgical History:  Procedure Laterality Date   CESAREAN SECTION     HERNIA REPAIR     ORTHOPEDIC SURGERY     There are no active problems to display for this patient.   PCP: Toribio Jerel MATSU, MD   REFERRING PROVIDER: Darnella Dorn SAUNDERS, MD   REFERRING DIAG: [phone] (ICD-10-CM) - Sciatica, right side   Rationale for Evaluation and Treatment:  Rehabiliation  THERAPY DIAG:  Other low back pain  Muscle weakness (generalized)  ONSET DATE: 02/20/24   SUBJECTIVE:                                                                                                                                                                                           SUBJECTIVE STATEMENT: Pt report falling onto her butt around thanksgiving slipping out of a rolling chair. She reports cracking her tail bone in this incident. Now she reports intermittent shooting pain in RLE when she stands. She reports she had previous dizzy spells attributing to underlying graves disease. She denies N/T, saddle parasthesia, or B/B issues. NEXT MD VISIT: none yet  PERTINENT HISTORY:  See above PMH  PAIN:  NPRS scale: 4/10 upon arrival Pain location: R lower back Pain description: intermittent, dull Aggravating factors: standing  Relieving factors: rest, meds   PRECAUTIONS: ,  None  RED FLAGS: None    WEIGHT BEARING  RESTRICTIONS:  No  FALLS:  Has patient fallen in last 6 months? Yes. Number of falls 5   OCCUPATION:  Taken out of work   PLOF:  Independent  PATIENT GOALS:  Patient would like to abolish radicular pain   OBJECTIVE:  Note: Objective measures were completed at Evaluation unless otherwise noted.  DIAGNOSTIC FINDINGS:  None   Patient-Specific Activity Scoring Scheme  0 represents unable to perform. 10 represents able to perform at prior level. 0 1 2 3 4 5 6 7 8 9  10 (Date and Score)  Activity Eval     1. Cleaning   5    2. Lifting   7    3.     4.    5.    Score 6    Total score = sum of the activity scores/number of activities Minimum detectable change (90%CI) for average score = 2 points Minimum detectable change (90%CI) for single activity score = 3 points     EDEMA:  No  POSTURE:  rounded shoulders, forward head, increased lumbar lordosis, and increased thoracic kyphosis  GAIT: Assistive device utilized: None Level of assistance: Complete Independence Comments: antalgic gait    LUMBAR ROM:   Active  A/PROM  eval  Flexion WFL  Extension Limited 25%  Right lateral flexion Limited 25%  Left lateral flexion WFL  Right rotation Limited 50%  Left rotation WFL   (Blank rows = not tested)   LOWER EXTREMITY ROM:     WFL: except R IR limited 25%    LOWER EXTREMITY MMT: 4/5 t/o R hip/knee; 4+/5 LLE      SPECIAL TESTS:  Lumbar Straight leg raise test: Negative and Slump test: Negative  TTP over L lumbar paraspinals L2-5  FUNCTIONAL TESTS:  Berg balance not tesed                                                                                                                            TREATMENT DATE:  Eval HEP creation and review with demonstration and trial set preformed, see below for details Selfcare:see education section below    PATIENT EDUCATION: Education details: HEP, PT plan of care, selfcare Person educated:  Patient Education method: Explanation, Demonstration, Verbal cues, and Handouts Education comprehension: verbalized understanding, further education recommended   HOME EXERCISE PROGRAM: Access Code: [email or web address] URL: [email or web address] Date: 04/15/2024 Prepared by: Massie Ada  Exercises - Seated Piriformis Stretch  - 1 x daily - 7 x weekly - 1 sets - 3 reps - 30sec hold - Standing Lumbar Extension  - 1 x daily - 7 x weekly - 1 sets - 10 reps - Bird Dog on Counter  - 1 x daily - 4 x weekly - 2-3 sets - 10 reps - 3s hold - Sidelying Thoracic Lumbar Rotation  - 1 x daily - 7 x weekly - 3 sets - 10 reps - 30 sec hold  ASSESSMENT:  CLINICAL IMPRESSION: Patient referred to PT for low back pain with radiating pain into RLE. Patient will benefit from skilled PT to improve overall function and to address impairments and limitations listed below.  OBJECTIVE IMPAIRMENTS: decreased activity tolerance for ADL's, difficulty walking, decreased balance, decreased endurance, decreased mobility, decreased ROM, decreased strength, impaired flexibility, impaired LE use, and pain.  ACTIVITY LIMITATIONS: bending, liftting, walking, standing, cleaning, community activity, driving, reaching, carry, occupation  PERSONAL FACTORS: see above PMH  also affecting patient's functional outcome.  REHAB POTENTIAL: Good  CLINICAL DECISION MAKING: Stable/uncomplicated  EVALUATION COMPLEXITY: Low  GOALS: Short term PT Goals Target date: 05/13/2024   Pt will be I and compliant with HEP. Baseline: No HEP Goal status: New    Long term PT goals Target date: 07/08/2024   Pt will improve ROM to [facility] to improve functional mobility Baseline: limited 25% Goal status: New Pt will improve  strength to at least 4+/5 MMT to improve functional strength Baseline:4/5 Goal status: New Pt will improve Patient specific functional scale (PSFS) to at least 9/10 to show improved function  level Baseline:6 Goal status: New Pt will reduce pain to overall less than 2/10 with usual activity and work activity. Baseline:4/10 Goal status: New  Pt will score age appropriate Berg balance score in order to decrease likelihood of falling.  Baseline: Not tested Goal status: New  PLAN: PT FREQUENCY: 1-3 times per week   PT DURATION: 6-12 weeks  PLANNED INTERVENTIONS  [id number]-Therapeutic exercises, 97530- Therapeutic activity, [id number]- Neuromuscular re-education, 97535- Self Care, [id number]- Manual therapy, and Patient/Family education  PLAN FOR NEXT SESSION: Myrna Levins, continue LE/Core strengthening, mobility, and manual therapy as indicated.   Massie Ada PT, DPT 04/15/24 2:10 PM   "

## 2024-04-20 LAB — LAB REPORT - SCANNED
Free T4: 0.78 ng/dL
TSH: 0.76 (ref 0.41–5.90)

## 2024-04-21 ENCOUNTER — Encounter: Payer: Self-pay | Admitting: Nurse Practitioner

## 2024-04-21 ENCOUNTER — Ambulatory Visit: Admitting: Nurse Practitioner

## 2024-04-21 VITALS — BP 104/74 | Ht 67.0 in | Wt 242.6 lb

## 2024-04-21 DIAGNOSIS — E0500 Thyrotoxicosis with diffuse goiter without thyrotoxic crisis or storm: Secondary | ICD-10-CM | POA: Diagnosis not present

## 2024-04-21 DIAGNOSIS — E0590 Thyrotoxicosis, unspecified without thyrotoxic crisis or storm: Secondary | ICD-10-CM

## 2024-04-21 MED ORDER — PROPRANOLOL HCL 20 MG PO TABS: 20.0000 mg | ORAL_TABLET | Freq: Two times a day (BID) | ORAL | 2 refills | Status: AC

## 2024-04-21 NOTE — Progress Notes (Signed)
 "         04/21/2024     Endocrinology Follow Up Note    Subjective:    Patient ID: Katherine Huff, female    DOB: 1975/10/14, PCP Toribio Jerel MATSU, MD.   History reviewed. No pertinent past medical history.  Past Surgical History:  Procedure Laterality Date   CESAREAN SECTION     HERNIA REPAIR     ORTHOPEDIC SURGERY      Social History   Socioeconomic History   Marital status: Single    Spouse name: Not on file   Number of children: Not on file   Years of education: Not on file   Highest education level: Not on file  Occupational History   Not on file  Tobacco Use   Smoking status: Every Day    Current packs/day: 1.00    Types: Cigarettes   Smokeless tobacco: Not on file  Vaping Use   Vaping status: Never Used  Substance and Sexual Activity   Alcohol use: No   Drug use: No   Sexual activity: Not on file  Other Topics Concern   Not on file  Social History Narrative   Not on file   Social Drivers of Health   Tobacco Use: High Risk (04/21/2024)   Patient History    Smoking Tobacco Use: Every Day    Smokeless Tobacco Use: Unknown    Passive Exposure: Not on file  Financial Resource Strain: Not on file  Food Insecurity: Not on file  Transportation Needs: Not on file  Physical Activity: Not on file  Stress: Not on file  Social Connections: Not on file  Depression (EYV7-0): Not on file  Alcohol Screen: Not on file  Housing: Not on file  Utilities: Not on file  Health Literacy: Not on file    History reviewed. No pertinent family history.  Outpatient Encounter Medications as of 04/21/2024  Medication Sig   ibuprofen  (ADVIL ) 200 MG tablet Take 200 mg by mouth every 6 (six) hours as needed for mild pain (pain score 1-3).   methimazole  (TAPAZOLE ) 10 MG tablet Take 1 tablet (10 mg total) by mouth 3 (three) times daily. (Patient taking differently: Take 10 mg by mouth daily.)   olmesartan (BENICAR) 20 MG tablet Take 20 mg by mouth daily.   [DISCONTINUED]  propranolol  (INDERAL ) 20 MG tablet Take 1 tablet (20 mg total) by mouth 2 (two) times daily.   propranolol  (INDERAL ) 20 MG tablet Take 1 tablet (20 mg total) by mouth 2 (two) times daily.   No facility-administered encounter medications on file as of 04/21/2024.    ALLERGIES: No Known Allergies  VACCINATION STATUS:  There is no immunization history on file for this patient.   HPI  Katherine Huff is 49 y.o. female who presents today with a medical history as above. she is being seen in follow up after being seen in consultation for hyperthyroidism requested by Toribio Jerel MATSU, MD.  she has been dealing with symptoms of hair loss, sweating, palpitations, dry skin, fatigue, anxiety, weight loss, heat intolerance, insomnia, and tremors for about 2 months. These symptoms are progressively worsening and troubling to her.  her most recent thyroid  labs revealed suppressed TSH < 0.005, elevated T4 of >24.9, T3 uptake of 54, and Free Thyroxine Index of >13.4 on 04/17/23.  she denies dysphagia, choking, shortness of breath, no recent voice change.    she does have family history of thyroid  dysfunction in her maternal aunts (one has Graves, the other has  Hashimotos), but denies family hx of thyroid  cancer. she is not on any anti-thyroid  medications nor on any thyroid  hormone supplements. Denies use of Biotin containing supplements.  she is willing to proceed with appropriate work up and therapy for thyrotoxicosis.  Her PCP did put her on Propanolol to help with HR control.   Review of systems  Constitutional: + increasing body weight,  current Body mass index is 38 kg/m. , + fatigue, no subjective hyperthermia, no subjective hypothermia Eyes: no blurry vision, no xerophthalmia ENT: no sore throat, no nodules palpated in throat, no dysphagia/odynophagia, no hoarseness Cardiovascular: no chest pain, no shortness of breath, no palpitations, no leg swelling Respiratory: no cough, no shortness of  breath Gastrointestinal: no nausea/vomiting/diarrhea Musculoskeletal: + diffuse muscle/joint aches-somewhat improved Skin: no rashes, no hyperemia Neurological: no tremors, no numbness, no tingling, no dizziness Psychiatric: no depression, no anxiety   Objective:    BP 104/74 (BP Location: Left Arm, Patient Position: Sitting, Cuff Size: Large)   Ht 5' 7 (1.702 m)   Wt 242 lb 9.6 oz (110 kg)   BMI 38.00 kg/m   Wt Readings from Last 3 Encounters:  04/21/24 242 lb 9.6 oz (110 kg)  03/10/24 234 lb 3.2 oz (106.2 kg)  01/09/24 232 lb 9.6 oz (105.5 kg)     BP Readings from Last 3 Encounters:  04/21/24 104/74  03/10/24 128/74  01/09/24 112/84     Physical Exam- Limited  Constitutional:  Body mass index is 38 kg/m. , not in acute distress, normal state of mind Eyes:  EOMI, no exophthalmos Musculoskeletal: no gross deformities, strength intact in all four extremities, no gross restriction of joint movements Skin:  no rashes, no hyperemia Neurological: no tremor with outstretched hands   CMP     Component Value Date/Time   NA 141 01/25/2023 1905   K 3.5 01/25/2023 1905   CL 110 01/25/2023 1905   CO2 23 01/25/2023 1905   GLUCOSE 104 (H) 01/25/2023 1905   BUN 9 01/25/2023 1905   CREATININE 0.37 (L) 01/25/2023 1905   CALCIUM 9.1 01/25/2023 1905   GFRNONAA >60 01/25/2023 1905     CBC    Component Value Date/Time   WBC 6.0 01/25/2023 1905   RBC 4.14 01/26/2023 0045   RBC 4.39 01/25/2023 1905   HGB 7.8 (L) 01/25/2023 1905   HCT 27.7 (L) 01/25/2023 1905   PLT 185 01/25/2023 1905   MCV 63.1 (L) 01/25/2023 1905   MCH 17.8 (L) 01/25/2023 1905   MCHC 28.2 (L) 01/25/2023 1905   RDW 19.2 (H) 01/25/2023 1905   LYMPHSABS 1.8 12/30/2009 0720   MONOABS 0.5 12/30/2009 0720   EOSABS 0.1 12/30/2009 0720   BASOSABS 0.0 12/30/2009 0720     Diabetic Labs (most recent): No results found for: HGBA1C, MICROALBUR  Lipid Panel  No results found for: CHOL, TRIG, HDL,  CHOLHDL, VLDL, LDLCALC, LDLDIRECT, LABVLDL   Lab Results  Component Value Date   TSH 0.76 04/20/2024   TSH <0.005 (L) 03/08/2024   TSH 4.330 01/08/2024   TSH 10.800 (H) 11/04/2023   TSH 6.540 (H) 08/22/2023   TSH <0.005 (L) 05/16/2023   TSH 0.01 (A) 04/17/2023   TSH <0.010 (L) 01/25/2023   FREET4 0.78 04/20/2024   FREET4 3.78 (H) 03/08/2024   FREET4 0.91 01/08/2024   FREET4 0.77 (L) 11/04/2023   FREET4 0.45 (L) 08/22/2023   FREET4 1.76 05/16/2023   FREET4 5.12 (H) 01/26/2023     Thyroid  Uptake and Scan from 04/01/23 CLINICAL  DATA:  Weight loss, anxiety, heat intolerance, increased appetite. Hyperthyroidism   EXAM: THYROID  SCAN AND UPTAKE - 4 AND 24 HOURS   TECHNIQUE: Following oral administration of I-123 capsule, anterior planar imaging was acquired at 24 hours. Thyroid  uptake was calculated with a thyroid  probe at 4-6 hours and 24 hours.   RADIOPHARMACEUTICALS:  423 uCi I-123 sodium iodide p.o.   COMPARISON:  None Available.   FINDINGS: Uniform distribution of radionuclide throughout the thyroid  parenchyma. No hot or cold defects identified.   4 hour I-123 uptake = 64.2% (normal 5-20%)   24 hour I-123 uptake = 68.1% (normal 10-30%)   IMPRESSION: Increased thyroid  uptake of radioiodine in keeping with diffuse toxic goiter (Graves disease)     Electronically Signed   By: Dorethia Molt M.D.   On: 04/11/2023 20:55   Latest Reference Range & Units 08/22/23 09:27 11/04/23 14:01 01/08/24 12:08 03/08/24 09:31 04/20/24 00:00  TSH 0.41 - 5.90  6.540 (H) 10.800 (H) 4.330 <0.005 (L) 0.76  Triiodothyronine,Free,Serum 2.0 - 4.4 pg/mL 2.6 3.3 3.4 10.6 (H)   T4,Free(Direct) ng/dL 9.54 (L) 9.22 (L) 9.08 3.78 (H) 0.78  (H): Data is abnormally high (L): Data is abnormally low Assessment & Plan:   1. Graves disease (Primary) 2. Hyperthyroidism  she is being seen at a kind request of Toribio Jerel MATSU, MD.  her history and most recent labs are reviewed, and she  was examined clinically. Subjective and objective findings are consistent with thyrotoxicosis likely from primary hyperthyroidism. The potential risks of untreated thyrotoxicosis and the need for definitive therapy have been discussed in detail with her, and she agrees to proceed with diagnostic workup and treatment plan.   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  Uptake and scan results show 4 hr uptake of 64.2% and 24 hr uptake of 68.1%, consistent with Graves Disease.   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  Her repeat thyroid  function tests show favorable response to re-initiation of Methimazole .  Will start tapering her off at this point to see if the RAI ablation was effective.  Will lower Methimazole  to 5 mg po daily for 2 weeks, then she can stop altogether.  Will repeat labs in 8 weeks and follow up in office to discuss results and next steps (possibly initiation of thyroid  hormone replacement therapy).  If labs revert back to over-active, may need to repeat uptake and scan to reassess as ablation may not have been successful.   -Patient is advised to maintain close follow up with Toribio Jerel MATSU, MD for primary care needs.   I spent  26  minutes in the care of the patient today including review of labs from Thyroid  Function, CMP, and other relevant labs ; imaging/biopsy records (current and previous including abstractions from other facilities); face-to-face time discussing  her lab results and symptoms, medications doses, her options of short and long term treatment based on the latest standards of care / guidelines;   and documenting the encounter.  Clotilda Darner  participated in the discussions, expressed understanding, and voiced agreement with the above plans.  All questions were answered to her satisfaction. she is encouraged to contact clinic should she have any questions or concerns prior to her return visit.   Follow up plan: Return in about  8 weeks (around 06/16/2024) for Thyroid  follow up, Previsit labs.   Thank you for involving me in the care of this pleasant patient, and I will continue to update you with her progress.  Angelina Neece  [facility] [address] [place], [place] [address] Phone: [phone] Fax: [phone]  04/21/2024, 2:24 PM  "

## 2024-04-21 NOTE — Patient Instructions (Signed)
 Lower Methimazole  to 5 mg daily (half of the 10 mg tablets) for 2 weeks then stop medication altogether.

## 2024-04-27 ENCOUNTER — Ambulatory Visit

## 2024-04-29 ENCOUNTER — Ambulatory Visit

## 2024-05-03 ENCOUNTER — Ambulatory Visit: Admitting: Physical Therapy

## 2024-05-05 ENCOUNTER — Ambulatory Visit

## 2024-05-10 ENCOUNTER — Ambulatory Visit

## 2024-05-12 ENCOUNTER — Ambulatory Visit

## 2024-05-17 ENCOUNTER — Ambulatory Visit: Admitting: Physical Therapy

## 2024-05-19 ENCOUNTER — Ambulatory Visit

## 2024-06-17 ENCOUNTER — Ambulatory Visit: Admitting: Nurse Practitioner
